# Patient Record
Sex: Female | Born: 1990 | Hispanic: Yes | Marital: Single | Smoking: Never smoker
Health system: Southern US, Community
[De-identification: ages and names within clinical notes are randomized; demographics above are authoritative.]

## PROBLEM LIST (undated history)

## (undated) DIAGNOSIS — Z8489 Family history of other specified conditions: Secondary | ICD-10-CM

## (undated) DIAGNOSIS — Z789 Other specified health status: Secondary | ICD-10-CM

## (undated) HISTORY — PX: ORIF ANKLE FRACTURE: SUR919

## (undated) HISTORY — PX: NO PAST SURGERIES: SHX2092

---

## 2017-06-18 ENCOUNTER — Other Ambulatory Visit: Payer: Self-pay | Admitting: Obstetrics & Gynecology

## 2017-06-18 DIAGNOSIS — Z363 Encounter for antenatal screening for malformations: Secondary | ICD-10-CM

## 2017-06-19 ENCOUNTER — Ambulatory Visit (INDEPENDENT_AMBULATORY_CARE_PROVIDER_SITE_OTHER): Payer: Self-pay

## 2017-06-19 DIAGNOSIS — Z363 Encounter for antenatal screening for malformations: Secondary | ICD-10-CM

## 2017-06-19 NOTE — Progress Notes (Signed)
US 24+1 wks,cephalic,anterior pl gr 0,normal ovaries bilat,svp of fluid 5.9 cm,fhr 144 bpm,EFW 832 g,cx 3.3 cm,anatomy complete,no obvious abnormalities

## 2017-06-26 ENCOUNTER — Encounter: Payer: Self-pay | Admitting: *Deleted

## 2017-06-28 ENCOUNTER — Encounter: Payer: Self-pay | Admitting: Advanced Practice Midwife

## 2017-06-28 ENCOUNTER — Ambulatory Visit: Payer: Self-pay | Admitting: *Deleted

## 2017-07-03 ENCOUNTER — Ambulatory Visit: Payer: Self-pay | Admitting: *Deleted

## 2017-07-03 ENCOUNTER — Encounter: Payer: Self-pay | Admitting: Advanced Practice Midwife

## 2017-07-11 ENCOUNTER — Encounter: Payer: Self-pay | Admitting: *Deleted

## 2017-07-11 ENCOUNTER — Encounter: Payer: Self-pay | Admitting: Advanced Practice Midwife

## 2017-07-11 ENCOUNTER — Ambulatory Visit: Payer: Self-pay | Admitting: *Deleted

## 2017-12-21 ENCOUNTER — Encounter (HOSPITAL_COMMUNITY): Payer: Self-pay

## 2018-05-04 ENCOUNTER — Emergency Department (HOSPITAL_BASED_OUTPATIENT_CLINIC_OR_DEPARTMENT_OTHER)
Admission: EM | Admit: 2018-05-04 | Discharge: 2018-05-05 | Disposition: A | Discharge status: Home / Self Care | Payer: Self-pay | Attending: Emergency Medicine | Admitting: Emergency Medicine

## 2018-05-04 ENCOUNTER — Emergency Department (HOSPITAL_BASED_OUTPATIENT_CLINIC_OR_DEPARTMENT_OTHER): Payer: Self-pay

## 2018-05-04 ENCOUNTER — Encounter (HOSPITAL_BASED_OUTPATIENT_CLINIC_OR_DEPARTMENT_OTHER): Payer: Self-pay | Admitting: *Deleted

## 2018-05-04 ENCOUNTER — Other Ambulatory Visit: Payer: Self-pay

## 2018-05-04 DIAGNOSIS — S82841A Displaced bimalleolar fracture of right lower leg, initial encounter for closed fracture: Secondary | ICD-10-CM | POA: Insufficient documentation

## 2018-05-04 DIAGNOSIS — Y929 Unspecified place or not applicable: Secondary | ICD-10-CM | POA: Insufficient documentation

## 2018-05-04 DIAGNOSIS — Y999 Unspecified external cause status: Secondary | ICD-10-CM | POA: Insufficient documentation

## 2018-05-04 DIAGNOSIS — X500XXA Overexertion from strenuous movement or load, initial encounter: Secondary | ICD-10-CM | POA: Insufficient documentation

## 2018-05-04 DIAGNOSIS — Y9389 Activity, other specified: Secondary | ICD-10-CM | POA: Insufficient documentation

## 2018-05-04 MED ORDER — OXYCODONE-ACETAMINOPHEN 5-325 MG PO TABS
1.0000 | ORAL_TABLET | Freq: Once | ORAL | Status: AC
  Administered 2018-05-04: 1 via ORAL
  Filled 2018-05-04: qty 1

## 2018-05-04 NOTE — ED Triage Notes (Signed)
Pt twisted right ankle while walking down steps. Spanish interpreter Shari Prows [phone] utilized

## 2018-05-04 NOTE — ED Notes (Signed)
ED Provider at bedside. 

## 2018-05-04 NOTE — ED Provider Notes (Signed)
[facility] EMERGENCY DEPARTMENT Provider Note   CSN: [id number] Arrival date & time: 05/04/18  2227    History   Chief Complaint Chief Complaint  Patient presents with  . Ankle Pain    HPI Katrina West is a 28 y.o. female.     Patient with right ankle pain after missing 1 step and twisting her right ankle around 9 PM.  She reports severe pain and swelling to both sides of her right ankle joint.  Denies hitting head or any other injury.  No neck or back pain.  She did not take any thing for it at home.  There is no numbness or tingling.  There is no chest pain or shortness of breath.  There are no breaks in the skin.  She denies any other medical problems or any other medications.  Translator used  The history is provided by the patient. The history is limited by a language barrier. A language interpreter was used.  Ankle Pain  Associated symptoms: no back pain, no fever and no neck pain     History reviewed. No pertinent past medical history.  There are no active problems to display for this patient.   History reviewed. No pertinent surgical history.   OB History    Gravida  1   Para      Term      Preterm      AB      Living        SAB      TAB      Ectopic      Multiple      Live Births               Home Medications    Prior to Admission medications   Not on File    Family History No family history on file.  Social History Social History   Tobacco Use  . Smoking status: Never Smoker  . Smokeless tobacco: Never Used  Substance Use Topics  . Alcohol use: Never    Frequency: Never  . Drug use: Never     Allergies   Patient has no known allergies.   Review of Systems Review of Systems  Constitutional: Negative for activity change, appetite change and fever.  HENT: Negative for congestion.   Respiratory: Negative for cough, chest tightness and shortness of breath.   Cardiovascular: Negative for chest pain.    Gastrointestinal: Negative for abdominal pain, nausea and vomiting.  Genitourinary: Negative for dysuria and hematuria.  Musculoskeletal: Positive for arthralgias and myalgias. Negative for back pain and neck pain.  Skin: Negative for rash.  Neurological: Negative for dizziness, weakness and headaches.   all other systems are negative except as noted in the HPI and PMH.    Physical Exam Updated Vital Signs BP 126/87 (BP Location: Right Arm)   Pulse 98   Temp 98.1 F (36.7 C) (Oral)   Resp 16   Ht 4' 9.09" (1.45 m)   Wt 59 kg   LMP 04/13/2018 (Approximate)   SpO2 100%   Breastfeeding No   BMI 28.05 kg/m   Physical Exam Vitals signs and nursing note reviewed.  Constitutional:      General: She is not in acute distress.    Appearance: She is well-developed.  HENT:     Head: Normocephalic and atraumatic.     Mouth/Throat:     Pharynx: No oropharyngeal exudate.  Eyes:     Conjunctiva/sclera: Conjunctivae normal.  Pupils: Pupils are equal, round, and reactive to light.  Neck:     Musculoskeletal: Normal range of motion and neck supple.     Comments: No meningismus. Cardiovascular:     Rate and Rhythm: Normal rate and regular rhythm.     Heart sounds: Normal heart sounds. No murmur.  Pulmonary:     Effort: Pulmonary effort is normal. No respiratory distress.     Breath sounds: Normal breath sounds.  Abdominal:     Palpations: Abdomen is soft.     Tenderness: There is no abdominal tenderness. There is no guarding or rebound.  Musculoskeletal: Normal range of motion.        General: No tenderness.     Right lower leg: Edema present.     Comments: There is diffuse swelling to the right ankle with medial and lateral malleoli tenderness.  No obvious deformity.  Intact DP PT pulse.  Patient able to wiggle toes.  No proximal fibular tenderness. No pain at base of fifth metatarsal. No breaks in the skin.  Skin:    General: Skin is warm.     Capillary Refill: Capillary  refill takes less than 2 seconds.  Neurological:     General: No focal deficit present.     Mental Status: She is alert and oriented to person, place, and time. Mental status is at baseline.     Cranial Nerves: No cranial nerve deficit.     Motor: No abnormal muscle tone.     Coordination: Coordination normal.     Comments:  5/5 strength throughout. CN 2-12 intact.Equal grip strength.   Psychiatric:        Behavior: Behavior normal.      ED Treatments / Results  Labs (all labs ordered are listed, but only abnormal results are displayed) Labs Reviewed - No data to display  EKG None  Radiology Dg Tibia/fibula Right  Result Date: 05/05/2018 CLINICAL DATA:  Right ankle fracture today. EXAM: RIGHT TIBIA AND FIBULA - 2 VIEW COMPARISON:  Right ankle radiographs earlier this day. FINDINGS: Bimalleolar fracture better assessed on ankle exam. The proximal tibia and fibula are intact. No proximal fracture. Nutrient channel in the mid fibula. IMPRESSION: Bimalleolar fracture better assessed on ankle exam. Proximal tibia and fibula are intact. Electronically Signed   By: Narda Rutherford M.D.   On: 05/05/2018 00:25   Dg Ankle Complete Right  Result Date: 05/04/2018 CLINICAL DATA:  Pain after trauma EXAM: RIGHT ANKLE - COMPLETE 3+ VIEW COMPARISON:  None. FINDINGS: A fracture seen through the medial malleolus. A fracture seen through the distal fibula. No dislocation. Significant soft tissue swelling, particularly laterally. No other abnormalities. IMPRESSION: Bimalleolar fractures in the ankle with significant soft tissue swelling. Electronically Signed   By: Gerome Sam III M.D   On: 05/04/2018 23:23    Procedures Procedures (including critical care time)  Medications Ordered in ED Medications  oxyCODONE-acetaminophen (PERCOCET/ROXICET) 5-325 MG per tablet 1 tablet (has no administration in time range)     Initial Impression / Assessment and Plan / ED Course  I have reviewed the triage  vital signs and the nursing notes.  Pertinent labs & imaging results that were available during my care of the patient were reviewed by me and considered in my medical decision making (see chart for details).        Mechanical fall with right ankle pain.  No other injury.  Ankle is neurovascularly intact.  There is soft tissue swelling with bimalleolar fracture seen on  x-ray. No significant displacement.  Proximal tibia and fibula in tact.  Patient splinted and provided crutches.  Pain control, nonweightbearing, elevation, ice, followup with orthopedics.  Return precautions discussed including worsening pain, numbness or tingling, temperature or color change of leg or any other concerns.  Final Clinical Impressions(s) / ED Diagnoses   Final diagnoses:  Closed bimalleolar fracture of right ankle, initial encounter    ED Discharge Orders    None       Ceazia Harb, Jeannett Senior, MD 05/05/18 [phone]

## 2018-05-05 MED ORDER — OXYCODONE-ACETAMINOPHEN 5-325 MG PO TABS: 1.0000 | ORAL_TABLET | ORAL | 0 refills | Status: DC | PRN

## 2018-05-05 NOTE — ED Notes (Addendum)
PMS intact before and after. Pt tolerated well. All questions answered via the interpreter.

## 2018-05-05 NOTE — ED Notes (Signed)
Pt was provided with discharge instructions at length  and education during her splint placement by the interpreter services. Pt's questions were answered and voiced understanding. Pt is aware to call ortho on Monday for f/u and also received education regarding her medications. Pt assisted out to the car in a wheelchair. Tolerated well.

## 2018-05-05 NOTE — Discharge Instructions (Signed)
Do not put weight on your right leg.  Keep leg elevated and use ice.  Follow-up with the orthopedic doctor because this may need to have surgical repair.  Return to the ED if you develop worsening pain, numbness, tingling, any other concerns.

## 2018-05-10 ENCOUNTER — Other Ambulatory Visit: Payer: Self-pay

## 2018-05-10 ENCOUNTER — Ambulatory Visit (INDEPENDENT_AMBULATORY_CARE_PROVIDER_SITE_OTHER): Payer: Self-pay | Admitting: Orthopaedic Surgery

## 2018-05-10 ENCOUNTER — Encounter (INDEPENDENT_AMBULATORY_CARE_PROVIDER_SITE_OTHER): Payer: Self-pay | Admitting: Physician Assistant

## 2018-05-10 DIAGNOSIS — W109XXA Fall (on) (from) unspecified stairs and steps, initial encounter: Secondary | ICD-10-CM

## 2018-05-10 DIAGNOSIS — M25571 Pain in right ankle and joints of right foot: Secondary | ICD-10-CM

## 2018-05-10 DIAGNOSIS — S82841A Displaced bimalleolar fracture of right lower leg, initial encounter for closed fracture: Secondary | ICD-10-CM

## 2018-05-10 MED ORDER — HYDROCODONE-ACETAMINOPHEN 5-325 MG PO TABS: 1.0000 | ORAL_TABLET | Freq: Three times a day (TID) | ORAL | 0 refills | Status: DC | PRN

## 2018-05-10 MED ORDER — ONDANSETRON HCL 4 MG PO TABS: 4.0000 mg | ORAL_TABLET | Freq: Three times a day (TID) | ORAL | 0 refills | Status: AC | PRN

## 2018-05-10 NOTE — Progress Notes (Signed)
Office Visit Note   Patient: Katrina West           Date of Birth: 1990-10-21           MRN: [id number] Visit Date: 05/10/2018              Requested by: No referring provider defined for this encounter. PCP: Patient, No Pcp Per   Assessment & Plan: Visit Diagnoses:  1. Closed bimalleolar fracture of right ankle, initial encounter     Plan: Impression is right ankle displaced bimalleolar fracture.  We will place the patient in a new short leg splint today.  She will elevate at all times over the next several days.  She will follow-up with [place] Tuesday when Dr. Deno Etienne is back in the office for surgical consultation which will likely be Wednesday or Thursday.  She has been instructed to go ahead and stop her naproxen and any other anti-inflammatories.  I will call in Norco and Zofran in the meantime.  Follow-Up Instructions: Return in about 4 days (around 05/14/2018) for sx consultation with xu.   Orders:  No orders of the defined types were placed in this encounter.  Meds ordered this encounter  Medications  . HYDROcodone-acetaminophen (NORCO) 5-325 MG tablet    Sig: Take 1 tablet by mouth 3 (three) times daily as needed for moderate pain.    Dispense:  20 tablet    Refill:  0  . ondansetron (ZOFRAN) 4 MG tablet    Sig: Take 1 tablet (4 mg total) by mouth 3 (three) times daily as needed for nausea or vomiting.    Dispense:  20 tablet    Refill:  0      Procedures: No procedures performed   Clinical Data: No additional findings.   Subjective: Chief Complaint  Patient presents with  . Right Ankle - Pain    HPI patient is a pleasant 28 year old Spanish-speaking girl who comes in today for right ankle injury.  On 05/04/2018, she fell going down a set of stairs.  She was seen in the ED where x-rays were obtained.  These showed a displaced bimalleolar fracture.  She was placed in a short leg splint nonweightbearing.  She comes in today for further evaluation treatment  recommendation.  She does note she has been taking oxycodone for pain but this is been causing moderate amount of nausea.  No fevers or chills.  Review of Systems as detailed in HPI.  All others reviewed and are negative.   Objective: Vital Signs: LMP 04/13/2018 (Approximate)   Physical Exam well-developed well-nourished female in no acute distress.  Alert and oriented x3.  Ortho Exam examination of her right ankle reveals marked swelling.  No evidence of fracture blisters.  Calf is soft and nontender.  She is neurovascularly intact distally.  Specialty Comments:  No specialty comments available.  Imaging: X-rays reviewed by me in canopy show a bimalleolar ankle fracture on the right   PMFS History: Patient Active Problem List   Diagnosis Date Noted  . Closed bimalleolar fracture of right ankle 05/10/2018   History reviewed. No pertinent past medical history.  History reviewed. No pertinent family history.  History reviewed. No pertinent surgical history. Social History   Occupational History  . Not on file  Tobacco Use  . Smoking status: Never Smoker  . Smokeless tobacco: Never Used  Substance and Sexual Activity  . Alcohol use: Never    Frequency: Never  . Drug use: Never  . Sexual  activity: Not on file

## 2018-05-14 ENCOUNTER — Encounter (HOSPITAL_BASED_OUTPATIENT_CLINIC_OR_DEPARTMENT_OTHER): Payer: Self-pay | Admitting: *Deleted

## 2018-05-14 ENCOUNTER — Other Ambulatory Visit: Payer: Self-pay

## 2018-05-14 ENCOUNTER — Ambulatory Visit (INDEPENDENT_AMBULATORY_CARE_PROVIDER_SITE_OTHER): Payer: Self-pay | Admitting: Orthopaedic Surgery

## 2018-05-14 ENCOUNTER — Encounter (INDEPENDENT_AMBULATORY_CARE_PROVIDER_SITE_OTHER): Payer: Self-pay | Admitting: Orthopaedic Surgery

## 2018-05-14 VITALS — Ht <= 58 in | Wt 130.0 lb

## 2018-05-14 DIAGNOSIS — S82841A Displaced bimalleolar fracture of right lower leg, initial encounter for closed fracture: Secondary | ICD-10-CM

## 2018-05-14 DIAGNOSIS — X501XXA Overexertion from prolonged static or awkward postures, initial encounter: Secondary | ICD-10-CM

## 2018-05-14 NOTE — Progress Notes (Signed)
   Office Visit Note   Patient: Katrina West           Date of Birth: [birth year]           MRN: [id number] Visit Date: 05/14/2018              Requested by: No referring provider defined for this encounter. PCP: Patient, No Pcp Per   Assessment & Plan: Visit Diagnoses:  1. Closed bimalleolar fracture of right ankle, initial encounter     Plan: We reviewed the x-rays and the treatment plan through the interpreter and discussed the details of the surgery as well as risks and benefits and alternatives.  She understands that close treatment for displaced bimalleolar ankle fracture is not recommended.  We will proceed with operative fixation.  Rehab and recovery were discussed today.  Continue with elevation.  We will plan on surgery later this week.   Today's encounter was more complex due to language barrier.  Follow-Up Instructions: Return for 2 week postop visit.   Orders:  No orders of the defined types were placed in this encounter.  No orders of the defined types were placed in this encounter.     Procedures: No procedures performed   Clinical Data: No additional findings.   Subjective: Chief Complaint  Patient presents with  . Right Ankle - Follow-up    Ms. Pehrson follows up today for her bimalleolar ankle fracture.  She is doing well overall.  Her swelling has improved.   Review of Systems   Objective: Vital Signs: Ht 4' 9.09" (1.45 m)   Wt 130 lb (59 kg)   BMI 28.04 kg/m   Physical Exam  Ortho Exam Right ankle exam shows mild swelling.  The skin does wrinkle to pinching.  No neurovascular compromise. Specialty Comments:  No specialty comments available.  Imaging: No results found.   PMFS History: Patient Active Problem List   Diagnosis Date Noted  . Closed bimalleolar fracture of right ankle 05/10/2018   No past medical history on file.  No family history on file.  No past surgical history on file. Social History   Occupational  History  . Not on file  Tobacco Use  . Smoking status: Never Smoker  . Smokeless tobacco: Never Used  Substance and Sexual Activity  . Alcohol use: Never    Frequency: Never  . Drug use: Never  . Sexual activity: Not on file

## 2018-05-16 ENCOUNTER — Encounter (HOSPITAL_BASED_OUTPATIENT_CLINIC_OR_DEPARTMENT_OTHER)
Admission: RE | Disposition: A | Discharge status: Home / Self Care | Payer: Self-pay | Source: Ambulatory Visit | Attending: Orthopaedic Surgery

## 2018-05-16 ENCOUNTER — Ambulatory Visit (HOSPITAL_BASED_OUTPATIENT_CLINIC_OR_DEPARTMENT_OTHER): Payer: Self-pay | Admitting: Certified Registered"

## 2018-05-16 ENCOUNTER — Encounter (HOSPITAL_BASED_OUTPATIENT_CLINIC_OR_DEPARTMENT_OTHER): Payer: Self-pay | Admitting: *Deleted

## 2018-05-16 ENCOUNTER — Ambulatory Visit (HOSPITAL_COMMUNITY): Payer: Self-pay

## 2018-05-16 ENCOUNTER — Other Ambulatory Visit: Payer: Self-pay

## 2018-05-16 ENCOUNTER — Ambulatory Visit (HOSPITAL_BASED_OUTPATIENT_CLINIC_OR_DEPARTMENT_OTHER)
Admission: RE | Admit: 2018-05-16 | Discharge: 2018-05-16 | Disposition: A | Discharge status: Home / Self Care | Payer: Self-pay | Source: Ambulatory Visit | Attending: Orthopaedic Surgery | Admitting: Orthopaedic Surgery

## 2018-05-16 DIAGNOSIS — S82891A Other fracture of right lower leg, initial encounter for closed fracture: Secondary | ICD-10-CM

## 2018-05-16 DIAGNOSIS — X58XXXA Exposure to other specified factors, initial encounter: Secondary | ICD-10-CM | POA: Insufficient documentation

## 2018-05-16 DIAGNOSIS — Z880 Allergy status to penicillin: Secondary | ICD-10-CM | POA: Insufficient documentation

## 2018-05-16 DIAGNOSIS — S82841A Displaced bimalleolar fracture of right lower leg, initial encounter for closed fracture: Secondary | ICD-10-CM | POA: Insufficient documentation

## 2018-05-16 HISTORY — DX: Family history of other specified conditions: Z84.89

## 2018-05-16 HISTORY — DX: Other specified health status: Z78.9

## 2018-05-16 HISTORY — PX: ORIF ANKLE FRACTURE: SHX5408

## 2018-05-16 LAB — POCT PREGNANCY, URINE: Preg Test, Ur: NEGATIVE

## 2018-05-16 SURGERY — OPEN REDUCTION INTERNAL FIXATION (ORIF) ANKLE FRACTURE
Anesthesia: Regional | Site: Ankle | Laterality: Right

## 2018-05-16 MED ORDER — LACTATED RINGERS IV SOLN
INTRAVENOUS | Status: DC
  Administered 2018-05-16 (×2): via INTRAVENOUS

## 2018-05-16 MED ORDER — CALCIUM CARBONATE-VITAMIN D 500-200 MG-UNIT PO TABS: 1.0000 | ORAL_TABLET | Freq: Three times a day (TID) | ORAL | 6 refills | Status: AC

## 2018-05-16 MED ORDER — CEFAZOLIN SODIUM-DEXTROSE 2-4 GM/100ML-% IV SOLN: 2.0000 g | INTRAVENOUS | Status: DC

## 2018-05-16 MED ORDER — PROPOFOL 10 MG/ML IV BOLUS
INTRAVENOUS | Status: DC | PRN
  Administered 2018-05-16: 200 mg via INTRAVENOUS

## 2018-05-16 MED ORDER — CEFAZOLIN SODIUM-DEXTROSE 2-4 GM/100ML-% IV SOLN
INTRAVENOUS | Status: AC
  Filled 2018-05-16: qty 100

## 2018-05-16 MED ORDER — ZINC SULFATE 220 (50 ZN) MG PO CAPS: 220.0000 mg | ORAL_CAPSULE | Freq: Every day | ORAL | 0 refills | Status: AC

## 2018-05-16 MED ORDER — FENTANYL CITRATE (PF) 100 MCG/2ML IJ SOLN
INTRAMUSCULAR | Status: AC
  Filled 2018-05-16: qty 2

## 2018-05-16 MED ORDER — KETOROLAC TROMETHAMINE 30 MG/ML IJ SOLN: 30.0000 mg | Freq: Once | INTRAMUSCULAR | Status: DC | PRN

## 2018-05-16 MED ORDER — 0.9 % SODIUM CHLORIDE (POUR BTL) OPTIME
TOPICAL | Status: DC | PRN
  Administered 2018-05-16: 4000 mL

## 2018-05-16 MED ORDER — ROPIVACAINE HCL 5 MG/ML IJ SOLN
INTRAMUSCULAR | Status: DC | PRN
  Administered 2018-05-16 (×2): 20 mL via PERINEURAL

## 2018-05-16 MED ORDER — LACTATED RINGERS IV SOLN
INTRAVENOUS | Status: DC
  Administered 2018-05-16: 12:00:00 via INTRAVENOUS

## 2018-05-16 MED ORDER — CHLORHEXIDINE GLUCONATE 4 % EX LIQD: 60.0000 mL | Freq: Once | CUTANEOUS | Status: DC

## 2018-05-16 MED ORDER — PROMETHAZINE HCL 25 MG/ML IJ SOLN: 6.2500 mg | INTRAMUSCULAR | Status: DC | PRN

## 2018-05-16 MED ORDER — ASPIRIN EC 81 MG PO TBEC: 81.0000 mg | DELAYED_RELEASE_TABLET | Freq: Two times a day (BID) | ORAL | 0 refills | Status: AC

## 2018-05-16 MED ORDER — ONDANSETRON HCL 4 MG PO TABS: 4.0000 mg | ORAL_TABLET | Freq: Three times a day (TID) | ORAL | 0 refills | Status: AC | PRN

## 2018-05-16 MED ORDER — HYDROMORPHONE HCL 1 MG/ML IJ SOLN: 0.2500 mg | INTRAMUSCULAR | Status: DC | PRN

## 2018-05-16 MED ORDER — OXYCODONE-ACETAMINOPHEN 5-325 MG PO TABS: 1.0000 | ORAL_TABLET | Freq: Three times a day (TID) | ORAL | 0 refills | Status: AC | PRN

## 2018-05-16 MED ORDER — MIDAZOLAM HCL 2 MG/2ML IJ SOLN
1.0000 mg | INTRAMUSCULAR | Status: DC | PRN
  Administered 2018-05-16: 2 mg via INTRAVENOUS

## 2018-05-16 MED ORDER — LIDOCAINE HCL (CARDIAC) PF 100 MG/5ML IV SOSY
PREFILLED_SYRINGE | INTRAVENOUS | Status: DC | PRN
  Administered 2018-05-16: 40 mg via INTRAVENOUS

## 2018-05-16 MED ORDER — OXYCODONE HCL 5 MG/5ML PO SOLN: 5.0000 mg | Freq: Once | ORAL | Status: DC | PRN

## 2018-05-16 MED ORDER — SCOPOLAMINE 1 MG/3DAYS TD PT72: 1.0000 | MEDICATED_PATCH | Freq: Once | TRANSDERMAL | Status: DC | PRN

## 2018-05-16 MED ORDER — ONDANSETRON HCL 4 MG/2ML IJ SOLN
INTRAMUSCULAR | Status: DC | PRN
  Administered 2018-05-16: 4 mg via INTRAVENOUS

## 2018-05-16 MED ORDER — FENTANYL CITRATE (PF) 100 MCG/2ML IJ SOLN
50.0000 ug | INTRAMUSCULAR | Status: AC | PRN
  Administered 2018-05-16 (×2): 25 ug via INTRAVENOUS
  Administered 2018-05-16 (×2): 50 ug via INTRAVENOUS

## 2018-05-16 MED ORDER — ACETAMINOPHEN 500 MG PO TABS
ORAL_TABLET | ORAL | Status: AC
  Filled 2018-05-16: qty 2

## 2018-05-16 MED ORDER — OXYCODONE HCL 5 MG PO TABS: 5.0000 mg | ORAL_TABLET | Freq: Once | ORAL | Status: DC | PRN

## 2018-05-16 MED ORDER — MIDAZOLAM HCL 2 MG/2ML IJ SOLN
INTRAMUSCULAR | Status: AC
  Filled 2018-05-16: qty 2

## 2018-05-16 MED ORDER — ACETAMINOPHEN 500 MG PO TABS
1000.0000 mg | ORAL_TABLET | Freq: Once | ORAL | Status: AC
  Administered 2018-05-16: 1000 mg via ORAL

## 2018-05-16 SURGICAL SUPPLY — 95 items
BANDAGE ACE 4X5 VEL STRL LF (GAUZE/BANDAGES/DRESSINGS) ×2 IMPLANT
BANDAGE ACE 6X5 VEL STRL LF (GAUZE/BANDAGES/DRESSINGS) ×2 IMPLANT
BANDAGE ESMARK 6X9 LF (GAUZE/BANDAGES/DRESSINGS) ×1 IMPLANT
BIT DRILL 2.7 QC CANN 155 (BIT) ×2 IMPLANT
BIT DRILL OVR 3.5AO QC SHRT SM (DRILL) ×1 IMPLANT
BIT DRILL QC 2.5MM SHRT EVO SM (DRILL) ×1 IMPLANT
BLADE HEX COATED 2.75 (ELECTRODE) ×2 IMPLANT
BLADE SURG 15 STRL LF DISP TIS (BLADE) ×2 IMPLANT
BLADE SURG 15 STRL SS (BLADE) ×2
BNDG COHESIVE 6X5 TAN STRL LF (GAUZE/BANDAGES/DRESSINGS) ×2 IMPLANT
BNDG ESMARK 6X9 LF (GAUZE/BANDAGES/DRESSINGS) ×2
BRUSH SCRUB EZ PLAIN DRY (MISCELLANEOUS) ×2 IMPLANT
CANISTER SUCT 1200ML W/VALVE (MISCELLANEOUS) ×2 IMPLANT
COVER BACK TABLE 60X90IN (DRAPES) ×2 IMPLANT
COVER MAYO STAND STRL (DRAPES) ×2 IMPLANT
COVER WAND RF STERILE (DRAPES) IMPLANT
CUFF TOURN SGL QUICK 24 (TOURNIQUET CUFF) ×1
CUFF TOURN SGL QUICK 34 (TOURNIQUET CUFF)
CUFF TRNQT CYL 24X4X16.5-23 (TOURNIQUET CUFF) ×1 IMPLANT
CUFF TRNQT CYL 34X4.125X (TOURNIQUET CUFF) IMPLANT
DECANTER SPIKE VIAL GLASS SM (MISCELLANEOUS) IMPLANT
DRAPE C-ARM 42X72 X-RAY (DRAPES) ×2 IMPLANT
DRAPE C-ARMOR (DRAPES) ×2 IMPLANT
DRAPE EXTREMITY T 121X128X90 (DISPOSABLE) ×2 IMPLANT
DRAPE IMP U-DRAPE 54X76 (DRAPES) ×2 IMPLANT
DRAPE INCISE IOBAN 66X45 STRL (DRAPES) IMPLANT
DRAPE SURG 17X23 STRL (DRAPES) ×4 IMPLANT
DRILL OVER 3.5 AO QC SHORT SM (DRILL) ×2
DRILL QC 2.5MM SHORT EVOS SM (DRILL) ×2
DRSG PAD ABDOMINAL 8X10 ST (GAUZE/BANDAGES/DRESSINGS) ×4 IMPLANT
DURAPREP 26ML APPLICATOR (WOUND CARE) ×4 IMPLANT
ELECT REM PT RETURN 9FT ADLT (ELECTROSURGICAL) ×2
ELECTRODE REM PT RTRN 9FT ADLT (ELECTROSURGICAL) ×1 IMPLANT
GAUZE SPONGE 4X4 12PLY STRL (GAUZE/BANDAGES/DRESSINGS) ×2 IMPLANT
GAUZE XEROFORM 1X8 LF (GAUZE/BANDAGES/DRESSINGS) ×2 IMPLANT
GLOVE BIO SURGEON STRL SZ 6.5 (GLOVE) ×2 IMPLANT
GLOVE BIOGEL PI IND STRL 7.0 (GLOVE) ×4 IMPLANT
GLOVE BIOGEL PI INDICATOR 7.0 (GLOVE) ×4
GLOVE ECLIPSE 6.5 STRL STRAW (GLOVE) ×4 IMPLANT
GLOVE ECLIPSE 7.0 STRL STRAW (GLOVE) ×2 IMPLANT
GLOVE SKINSENSE NS SZ7.5 (GLOVE) ×1
GLOVE SKINSENSE STRL SZ7.5 (GLOVE) ×1 IMPLANT
GLOVE SURG SYN 7.5  E (GLOVE) ×1
GLOVE SURG SYN 7.5 E (GLOVE) ×1 IMPLANT
GOWN STRL REIN XL XLG (GOWN DISPOSABLE) ×2 IMPLANT
GOWN STRL REUS W/ TWL LRG LVL3 (GOWN DISPOSABLE) ×1 IMPLANT
GOWN STRL REUS W/ TWL XL LVL3 (GOWN DISPOSABLE) ×2 IMPLANT
GOWN STRL REUS W/TWL LRG LVL3 (GOWN DISPOSABLE) ×1
GOWN STRL REUS W/TWL XL LVL3 (GOWN DISPOSABLE) ×2
GUIDE PIN 1.3 (PIN) ×4
MANIFOLD NEPTUNE II (INSTRUMENTS) ×2 IMPLANT
NEEDLE HYPO 22GX1.5 SAFETY (NEEDLE) IMPLANT
NS IRRIG 1000ML POUR BTL (IV SOLUTION) ×6 IMPLANT
PACK BASIN DAY SURGERY FS (CUSTOM PROCEDURE TRAY) ×2 IMPLANT
PAD CAST 3X4 CTTN HI CHSV (CAST SUPPLIES) IMPLANT
PAD CAST 4YDX4 CTTN HI CHSV (CAST SUPPLIES) ×1 IMPLANT
PADDING CAST COTTON 3X4 STRL (CAST SUPPLIES)
PADDING CAST COTTON 4X4 STRL (CAST SUPPLIES) ×1
PADDING CAST COTTON 6X4 STRL (CAST SUPPLIES) IMPLANT
PADDING CAST SYN 6 (CAST SUPPLIES) ×1
PADDING CAST SYNTHETIC 4 (CAST SUPPLIES) ×1
PADDING CAST SYNTHETIC 4X4 STR (CAST SUPPLIES) ×1 IMPLANT
PADDING CAST SYNTHETIC 6X4 NS (CAST SUPPLIES) ×1 IMPLANT
PENCIL BUTTON HOLSTER BLD 10FT (ELECTRODE) ×2 IMPLANT
PIN GUIDE 1.3 (PIN) ×2 IMPLANT
PLATE 3.5 LOCK 7H 1/3 TUBULAR (Plate) ×2 IMPLANT
SCREW CANC 4.7X14MM (Screw) ×2 IMPLANT
SCREW CANC EVOS 4.7X12 FT (Screw) ×2 IMPLANT
SCREW CANNULATED 4.0X35 (Screw) ×2 IMPLANT
SCREW CANNULATED 4.0X36 (Screw) ×2 IMPLANT
SCREW CORT 3.5X10MM ST EVOS (Screw) ×4 IMPLANT
SCREW CORT 3.5X11 ST EVOS (Screw) ×2 IMPLANT
SCREW CORTEX 3.5X18 EVOS (Screw) ×2 IMPLANT
SCREW LOCK 3.5X14MM EVOS (Screw) ×2 IMPLANT
SCREW OST EVOS FT 4.7X16 (Screw) ×2 IMPLANT
SHEET MEDIUM DRAPE 40X70 STRL (DRAPES) ×2 IMPLANT
SLEEVE SCD COMPRESS KNEE MED (MISCELLANEOUS) ×2 IMPLANT
SPLINT FIBERGLASS 4X30 (CAST SUPPLIES) IMPLANT
SPONGE LAP 18X18 RF (DISPOSABLE) ×2 IMPLANT
SUCTION FRAZIER HANDLE 10FR (MISCELLANEOUS) ×1
SUCTION TUBE FRAZIER 10FR DISP (MISCELLANEOUS) ×1 IMPLANT
SUT ETHILON 3 0 PS 1 (SUTURE) ×8 IMPLANT
SUT VIC AB 0 CT1 27 (SUTURE)
SUT VIC AB 0 CT1 27XBRD ANBCTR (SUTURE) IMPLANT
SUT VIC AB 2-0 CT1 27 (SUTURE) ×2
SUT VIC AB 2-0 CT1 TAPERPNT 27 (SUTURE) ×2 IMPLANT
SUT VIC AB 3-0 SH 27 (SUTURE)
SUT VIC AB 3-0 SH 27X BRD (SUTURE) IMPLANT
SYR BULB 3OZ (MISCELLANEOUS) ×2 IMPLANT
SYR CONTROL 10ML LL (SYRINGE) IMPLANT
TOWEL GREEN STERILE FF (TOWEL DISPOSABLE) ×2 IMPLANT
TRAY DSU PREP LF (CUSTOM PROCEDURE TRAY) ×2 IMPLANT
TUBE CONNECTING 20X1/4 (TUBING) ×2 IMPLANT
UNDERPAD 30X30 (UNDERPADS AND DIAPERS) ×2 IMPLANT
YANKAUER SUCT BULB TIP NO VENT (SUCTIONS) ×2 IMPLANT

## 2018-05-16 NOTE — Transfer of Care (Signed)
Immediate Anesthesia Transfer of Care Note  Patient: Katrina West  Procedure(s) Performed: OPEN REDUCTION INTERNAL FIXATION (ORIF) RIGHT BIMALLEOLAR ANKLE FRACTURE (Right Ankle)  Patient Location: PACU  Anesthesia Type:General  Level of Consciousness: awake, alert  and oriented  Airway & Oxygen Therapy: Patient Spontanous Breathing and Patient connected to face mask oxygen  Post-op Assessment: Report given to RN and Post -op Vital signs reviewed and stable  Post vital signs: Reviewed and stable  Last Vitals:  Vitals Value Taken Time  BP 98/64 05/16/2018  2:30 PM  Temp    Pulse 87 05/16/2018  2:31 PM  Resp 19 05/16/2018  2:31 PM  SpO2 100 % 05/16/2018  2:31 PM  Vitals shown include unvalidated device data.  Last Pain:  Vitals:   05/16/18 1130  TempSrc: Oral  PainSc: 0-No pain         Complications: No apparent anesthesia complications

## 2018-05-16 NOTE — Anesthesia Procedure Notes (Signed)
Procedure Name: LMA Insertion Date/Time: 05/16/2018 1:07 PM Performed by: Burna Cash, CRNA Pre-anesthesia Checklist: Patient identified, Emergency Drugs available, Suction available and Patient being monitored Patient Re-evaluated:Patient Re-evaluated prior to induction Oxygen Delivery Method: Circle system utilized Preoxygenation: Pre-oxygenation with 100% oxygen Induction Type: IV induction Ventilation: Mask ventilation without difficulty LMA: LMA inserted LMA Size: 4.0 Number of attempts: 1 Airway Equipment and Method: Bite block Placement Confirmation: positive ETCO2 Tube secured with: Tape Dental Injury: Teeth and Oropharynx as per pre-operative assessment

## 2018-05-16 NOTE — Anesthesia Postprocedure Evaluation (Signed)
Anesthesia Post Note  Patient: Gazella Sterman  Procedure(s) Performed: OPEN REDUCTION INTERNAL FIXATION (ORIF) RIGHT BIMALLEOLAR ANKLE FRACTURE (Right Ankle)     Patient location during evaluation: PACU Anesthesia Type: Regional and General Level of consciousness: awake and alert Pain management: pain level controlled Vital Signs Assessment: post-procedure vital signs reviewed and stable Respiratory status: spontaneous breathing, nonlabored ventilation, respiratory function stable and patient connected to nasal cannula oxygen Cardiovascular status: blood pressure returned to baseline and stable Postop Assessment: no apparent nausea or vomiting Anesthetic complications: no    Last Vitals:  Vitals:   05/16/18 1515 05/16/18 1537  BP:  120/88  Pulse:  82  Resp: 16 18  Temp:  36.6 C  SpO2: 100% 100%    Last Pain:  Vitals:   05/16/18 1537  TempSrc:   PainSc: 2         RLE Motor Response: Purposeful movement (05/16/18 1537) RLE Sensation: Decreased (05/16/18 1537)      Afton Lavalle P Dmitri Pettigrew

## 2018-05-16 NOTE — Op Note (Signed)
Date of Surgery: 05/16/2018  INDICATIONS: Katrina West is a 28 y.o.-year-old female who sustained a right ankle fracture; she was indicated for open reduction and internal fixation due to the displaced nature of the articular fracture and came to the operating room today for this procedure. The patient did consent to the procedure after discussion of the risks and benefits.  PREOPERATIVE DIAGNOSIS: right bimalleolar ankle fracture  POSTOPERATIVE DIAGNOSIS: Same.  PROCEDURE: Open treatment of right ankle fracture with internal fixation. Bimalleolar CPT [phone] SURGEON: N. Glee Arvin, M.D.  ASSIST: Starlyn Skeans [place], [place]; necessary for the timely completion of procedure and due to complexity of procedure.  ANESTHESIA:  general, popliteal block  TOURNIQUET TIME: less than 60 mins  IV FLUIDS AND URINE: See anesthesia.  ESTIMATED BLOOD LOSS: minimal mL.  IMPLANTS: [employer]  COMPLICATIONS: None.  DESCRIPTION OF PROCEDURE: The patient was brought to the operating room and placed supine on the operating table.  The patient had been signed prior to the procedure and this was documented. The patient had the anesthesia placed by the anesthesiologist.  A nonsterile tourniquet was placed on the upper thigh.  The prep verification and incision time-outs were performed to confirm that this was the correct patient, site, side and location. The patient had an SCD on the opposite lower extremity. The patient did receive antibiotics prior to the incision and was re-dosed during the procedure as needed at indicated intervals.  The patient had the lower extremity prepped and draped in the standard surgical fashion.  The extremity was exsanguinated using an esmarch bandage and the tourniquet was inflated to 300 mm Hg.  An incision was made over the lateral aspect of the distal fibula.  Dissection was carried through the subcutaneous tissue and full-thickness flaps were elevated off of the fibula.   The fracture line was visualized and organized hematoma and entrapped periosteum were removed from the fracture site.  The fracture was then reduced using traction and rotation and clamped with a tenaculum.  I then placed a single anterior to posterior lag screw using standard AO technique which gave excellent purchase and compression across the fracture.  The clamp was then removed and I placed a semitubular locking plate on the lateral aspect of the fibula at the appropriate position using fluoroscopic guidance.  Nonlocking and locking screws were then placed through the plate bicortically using standard AO technique.  I then made a separate incision over the medial malleolus and dissection was carried through the subcutaneous tissue and full-thickness flaps were elevated.  The fracture was booked open and the entrapped periosteum was removed.  The fracture was then reduced with a tenaculum and 2 parallel K wires were advanced across the fracture using fluoroscopic guidance.  I then placed 2 cannulated screws over the K wires with one being partially-threaded to compress the fracture and the second 1 being fully threaded to hold the fracture.  The K wires were then removed.  Stress exam of the ankle showed no widening medial clear space.  The wounds were then thoroughly irrigated and closed in layered fashion using 0 Vicryl, 2-0 Vicryl, 3-0 nylon.  Sterile dressings were applied.  Short leg splint was placed.  Patient tolerated procedure well had no immediate complications.  POSTOPERATIVE PLAN: Ms. Krotzer will remain nonweightbearing on this leg for approximately 6 weeks; Ms. Ruan will return for suture removal in 2 weeks.  He will be immobilized in a short leg splint and then transitioned to a CAM walker  at his first follow up appointment.  Ms. Aston will receive DVT prophylaxis based on other medications, activity level, and risk ratio of bleeding to thrombosis.  Mayra Reel, MD [facility] [phone] 2:07 PM

## 2018-05-16 NOTE — Anesthesia Procedure Notes (Signed)
Anesthesia Regional Block: Popliteal block   Pre-Anesthetic Checklist: ,, timeout performed, Correct Patient, Correct Site, Correct Laterality, Correct Procedure, Correct Position, site marked, Risks and benefits discussed,  Surgical consent,  Pre-op evaluation,  At surgeon's request and post-op pain management  Laterality: Right  Prep: chloraprep       Needles:  Injection technique: Single-shot  Needle Type: Echogenic Stimulator Needle     Needle Length: 9cm  Needle Gauge: 21     Additional Needles:   Procedures:,,,, ultrasound used (permanent image in chart),,,,  Narrative:  Start time: 05/16/2018 12:25 PM End time: 05/16/2018 12:35 PM Injection made incrementally with aspirations every 5 mL.  Performed by: Personally  Anesthesiologist: Leonides Grills, MD  Additional Notes: Functioning IV was confirmed and monitors were applied.  A timeout was performed. Sterile prep, hand hygiene and sterile gloves were used. A 36mm 21ga Arrow echogenic stimulator needle was used. Negative aspiration and negative test dose prior to incremental administration of local anesthetic. The patient tolerated the procedure well.  Ultrasound guidance: relevent anatomy identified, needle position confirmed, local anesthetic spread visualized around nerve(s), vascular puncture avoided.  Image printed for medical record.

## 2018-05-16 NOTE — H&P (Signed)
PREOPERATIVE H&P  Chief Complaint: right bimalleolar ankle fracture  HPI: Katrina West is a 28 y.o. female who presents for surgical treatment of right bimalleolar ankle fracture.  She denies any changes in medical history.  Past Medical History:  Diagnosis Date  . Family history of adverse reaction to anesthesia    uncle "hard time breathing with anesthesia"  . Medical history non-contributory    Past Surgical History:  Procedure Laterality Date  . NO PAST SURGERIES     Social History   Socioeconomic History  . Marital status: Single    Spouse name: Not on file  . Number of children: Not on file  . Years of education: Not on file  . Highest education level: Not on file  Occupational History  . Not on file  Social Needs  . Financial resource strain: Not on file  . Food insecurity:    Worry: Not on file    Inability: Not on file  . Transportation needs:    Medical: Not on file    Non-medical: Not on file  Tobacco Use  . Smoking status: Never Smoker  . Smokeless tobacco: Never Used  Substance and Sexual Activity  . Alcohol use: Never    Frequency: Never  . Drug use: Never  . Sexual activity: Not on file  Lifestyle  . Physical activity:    Days per week: Not on file    Minutes per session: Not on file  . Stress: Not on file  Relationships  . Social connections:    Talks on phone: Not on file    Gets together: Not on file    Attends religious service: Not on file    Active member of club or organization: Not on file    Attends meetings of clubs or organizations: Not on file    Relationship status: Not on file  Other Topics Concern  . Not on file  Social History Narrative  . Not on file   History reviewed. No pertinent family history. Allergies  Allergen Reactions  . Penicillins     Rash    Prior to Admission medications   Medication Sig Start Date End Date Taking? Authorizing Provider  HYDROcodone-acetaminophen (NORCO) 5-325 MG tablet  Take 1 tablet by mouth 3 (three) times daily as needed for moderate pain. 05/10/18  Yes Cristie Hem, PA-C  oxyCODONE-acetaminophen (PERCOCET/ROXICET) 5-325 MG tablet Take 1 tablet by mouth every 4 (four) hours as needed for severe pain. 05/05/18  Yes Rancour, Jeannett Senior, MD  ondansetron (ZOFRAN) 4 MG tablet Take 1 tablet (4 mg total) by mouth 3 (three) times daily as needed for nausea or vomiting. 05/10/18   Cristie Hem, PA-C     Positive ROS: All other systems have been reviewed and were otherwise negative with the exception of those mentioned in the HPI and as above.  Physical Exam: General: Alert, no acute distress Cardiovascular: No pedal edema Respiratory: No cyanosis, no use of accessory musculature GI: abdomen soft Skin: No lesions in the area of chief complaint Neurologic: Sensation intact distally Psychiatric: Patient is competent for consent with normal mood and affect Lymphatic: no lymphedema  MUSCULOSKELETAL: exam stable  Assessment: right bimalleolar ankle fracture  Plan: Plan for Procedure(s): OPEN REDUCTION INTERNAL FIXATION (ORIF) RIGHT BIMALLEOLAR ANKLE FRACTURE  The risks benefits and alternatives were discussed with the patient including but not limited to the risks of nonoperative treatment, versus surgical intervention including infection, bleeding, nerve injury,  blood clots, cardiopulmonary complications, morbidity,  mortality, among others, and they were willing to proceed.   Glee Arvin, MD   05/16/2018 11:50 AM

## 2018-05-16 NOTE — Discharge Instructions (Signed)
1. Keep splint clean and dry 2. Elevate foot above level of the heart 3. Take aspirin to prevent blood clots 4. Take pain meds as needed 5. Strict non weight bearing to operative extremity     Post Anesthesia Home Care Instructions  Activity: Get plenty of rest for the remainder of the day. A responsible individual must stay with you for 24 hours following the procedure.  For the next 24 hours, DO NOT: -Drive a car -Advertising copywriter -Drink alcoholic beverages -Take any medication unless instructed by your physician -Make any legal decisions or sign important papers.  Meals: Start with liquid foods such as gelatin or soup. Progress to regular foods as tolerated. Avoid greasy, spicy, heavy foods. If nausea and/or vomiting occur, drink only clear liquids until the nausea and/or vomiting subsides. Call your physician if vomiting continues.  Special Instructions/Symptoms: Your throat may feel dry or sore from the anesthesia or the breathing tube placed in your throat during surgery. If this causes discomfort, gargle with warm salt water. The discomfort should disappear within 24 hours.  If you had a scopolamine patch placed behind your ear for the management of post- operative nausea and/or vomiting:  1. The medication in the patch is effective for 72 hours, after which it should be removed.  Wrap patch in a tissue and discard in the trash. Wash hands thoroughly with soap and water. 2. You may remove the patch earlier than 72 hours if you experience unpleasant side effects which may include dry mouth, dizziness or visual disturbances. 3. Avoid touching the patch. Wash your hands with soap and water after contact with the patch.     Post Anesthesia Home Care Instructions  Activity: Get plenty of rest for the remainder of the day. A responsible individual must stay with you for 24 hours following the procedure.  For the next 24 hours, DO NOT: -Drive a car -Social worker -Drink alcoholic beverages -Take any medication unless instructed by your physician -Make any legal decisions or sign important papers.  Meals: Start with liquid foods such as gelatin or soup. Progress to regular foods as tolerated. Avoid greasy, spicy, heavy foods. If nausea and/or vomiting occur, drink only clear liquids until the nausea and/or vomiting subsides. Call your physician if vomiting continues.  Special Instructions/Symptoms: Your throat may feel dry or sore from the anesthesia or the breathing tube placed in your throat during surgery. If this causes discomfort, gargle with warm salt water. The discomfort should disappear within 24 hours.  If you had a scopolamine patch placed behind your ear for the management of post- operative nausea and/or vomiting:  1. The medication in the patch is effective for 72 hours, after which it should be removed.  Wrap patch in a tissue and discard in the trash. Wash hands thoroughly with soap and water. 2. You may remove the patch earlier than 72 hours if you experience unpleasant side effects which may include dry mouth, dizziness or visual disturbances. 3. Avoid touching the patch. Wash your hands with soap and water after contact with the patch.       Regional Anesthesia Blocks  1. Numbness or the inability to move the "blocked" extremity may last from 3-48 hours after placement. The length of time depends on the medication injected and your individual response to the medication. If the numbness is not going away after 48 hours, call your surgeon.  2. The extremity that is blocked will need to be protected until the  numbness is gone and the  Strength has returned. Because you cannot feel it, you will need to take extra care to avoid injury. Because it may be weak, you may have difficulty moving it or using it. You may not know what position it is in without looking at it while the block is in effect.  3. For blocks in the legs and  feet, returning to weight bearing and walking needs to be done carefully. You will need to wait until the numbness is entirely gone and the strength has returned. You should be able to move your leg and foot normally before you try and bear weight or walk. You will need someone to be with you when you first try to ensure you do not fall and possibly risk injury.  4. Bruising and tenderness at the needle site are common side effects and will resolve in a few days.  5. Persistent numbness or new problems with movement should be communicated to the surgeon or the [facility] [phone] [facility] [phone]).

## 2018-05-16 NOTE — Progress Notes (Signed)
AssistedDr. Ellender with right, ultrasound guided, popliteal/saphenous block. Side rails up, monitors on throughout procedure. See vital signs in flow sheet. Tolerated Procedure well.  

## 2018-05-16 NOTE — Anesthesia Procedure Notes (Signed)
Anesthesia Regional Block: Adductor canal block   Pre-Anesthetic Checklist: ,, timeout performed, Correct Patient, Correct Site, Correct Laterality, Correct Procedure,, site marked, risks and benefits discussed, Surgical consent,  Pre-op evaluation,  At surgeon's request and post-op pain management  Laterality: Right  Prep: chloraprep       Needles:  Injection technique: Single-shot  Needle Type: Echogenic Stimulator Needle     Needle Length: 9cm  Needle Gauge: 21     Additional Needles:   Procedures:,,,, ultrasound used (permanent image in chart),,,,  Narrative:  Start time: 05/16/2018 12:30 PM End time: 05/16/2018 12:40 PM Injection made incrementally with aspirations every 5 mL.  Performed by: Personally  Anesthesiologist: Leonides Grills, MD  Additional Notes: Functioning IV was confirmed and monitors were applied. A time-out was performed. Hand hygiene and sterile gloves were used. The thigh was placed in a frog-leg position and prepped in a sterile fashion. A 58mm 21ga Arrow echogenic stimulator needle was placed using ultrasound guidance.  Negative aspiration and negative test dose prior to incremental administration of local anesthetic. The patient tolerated the procedure well.

## 2018-05-16 NOTE — Anesthesia Preprocedure Evaluation (Addendum)
Anesthesia Evaluation  Patient identified by MRN, date of birth, ID band Patient awake    Reviewed: Allergy & Precautions, NPO status , Patient's Chart, lab work & pertinent test results  Airway Mallampati: II  TM Distance: >3 FB Neck ROM: Full    Dental no notable dental hx.    Pulmonary neg pulmonary ROS,    Pulmonary exam normal breath sounds clear to auscultation       Cardiovascular negative cardio ROS Normal cardiovascular exam Rhythm:Regular Rate:Normal     Neuro/Psych negative neurological ROS  negative psych ROS   GI/Hepatic negative GI ROS, Neg liver ROS,   Endo/Other  negative endocrine ROS  Renal/GU negative Renal ROS     Musculoskeletal negative musculoskeletal ROS (+)   Abdominal   Peds  Hematology negative hematology ROS (+)   Anesthesia Other Findings Right bimalleolar ankle fracture  Reproductive/Obstetrics hcg negative                            Anesthesia Physical Anesthesia Plan  ASA: I  Anesthesia Plan: General and Regional   Post-op Pain Management: GA combined w/ Regional for post-op pain   Induction: Intravenous  PONV Risk Score and Plan: 3 and Ondansetron, Dexamethasone, Midazolam and Treatment may vary due to age or medical condition  Airway Management Planned: LMA  Additional Equipment:   Intra-op Plan:   Post-operative Plan: Extubation in OR  Informed Consent: I have reviewed the patients History and Physical, chart, labs and discussed the procedure including the risks, benefits and alternatives for the proposed anesthesia with the patient or authorized representative who has indicated his/her understanding and acceptance.     Dental advisory given  Plan Discussed with: CRNA  Anesthesia Plan Comments:         Anesthesia Quick Evaluation

## 2018-05-18 ENCOUNTER — Encounter (HOSPITAL_COMMUNITY): Payer: Self-pay | Admitting: Emergency Medicine

## 2018-05-18 ENCOUNTER — Emergency Department (HOSPITAL_COMMUNITY): Payer: Self-pay

## 2018-05-18 ENCOUNTER — Emergency Department (HOSPITAL_COMMUNITY)
Admission: EM | Admit: 2018-05-18 | Discharge: 2018-05-18 | Disposition: A | Discharge status: Home / Self Care | Payer: Self-pay | Attending: Emergency Medicine | Admitting: Emergency Medicine

## 2018-05-18 ENCOUNTER — Other Ambulatory Visit: Payer: Self-pay

## 2018-05-18 DIAGNOSIS — G8918 Other acute postprocedural pain: Secondary | ICD-10-CM | POA: Insufficient documentation

## 2018-05-18 DIAGNOSIS — Z7982 Long term (current) use of aspirin: Secondary | ICD-10-CM | POA: Insufficient documentation

## 2018-05-18 DIAGNOSIS — Z79899 Other long term (current) drug therapy: Secondary | ICD-10-CM | POA: Insufficient documentation

## 2018-05-18 MED ORDER — OXYCODONE-ACETAMINOPHEN 5-325 MG PO TABS
2.0000 | ORAL_TABLET | Freq: Once | ORAL | Status: AC
  Administered 2018-05-18: 2 via ORAL
  Filled 2018-05-18: qty 2

## 2018-05-18 NOTE — ED Notes (Signed)
Discharge instructions, pain management, and follow up appt discussed with pt. Pt. Has no questions at this time.

## 2018-05-18 NOTE — ED Triage Notes (Signed)
Pt had ORIF R ankle on 3/19 and reports uncontrolled pain at home.  States she still has pain medication.

## 2018-05-18 NOTE — Progress Notes (Signed)
Orthopedic Tech Progress Note Patient Details:  Katrina West 30-Jun-1990 [id number]  Ortho Devices Type of Ortho Device: Short leg splint Ortho Device/Splint Interventions: Adjustment, Application, Ordered   Post Interventions Patient Tolerated: Well Instructions Provided: Adjustment of device, Care of device   Norva Karvonen T 05/18/2018, 4:01 AM

## 2018-05-18 NOTE — ED Provider Notes (Addendum)
[facility] EMERGENCY DEPARTMENT Provider Note   CSN: [id number] Arrival date & time: 05/18/18  0116    History   Chief Complaint Chief Complaint  Patient presents with  . post-op pain    HPI Katrina West is a 28 y.o. female.     ORIF for ankle fracture 3/19, here with lateral pain to right foot worse when she puts pressure on it. Just since splint placed. No other symptoms.      Past Medical History:  Diagnosis Date  . Family history of adverse reaction to anesthesia    uncle "hard time breathing with anesthesia"  . Medical history non-contributory     Patient Active Problem List   Diagnosis Date Noted  . Closed bimalleolar fracture of right ankle 05/10/2018    Past Surgical History:  Procedure Laterality Date  . NO PAST SURGERIES    . ORIF ANKLE FRACTURE       OB History    Gravida  1   Para      Term      Preterm      AB      Living        SAB      TAB      Ectopic      Multiple      Live Births               Home Medications    Prior to Admission medications   Medication Sig Start Date End Date Taking? Authorizing Provider  aspirin EC 81 MG tablet Take 1 tablet (81 mg total) by mouth 2 (two) times daily. 05/16/18   Tarry Kos, MD  calcium-vitamin D (OSCAL WITH D) 500-200 MG-UNIT tablet Take 1 tablet by mouth 3 (three) times daily. 05/16/18   Tarry Kos, MD  ondansetron (ZOFRAN) 4 MG tablet Take 1 tablet (4 mg total) by mouth 3 (three) times daily as needed for nausea or vomiting. 05/10/18   Cristie Hem, PA-C  ondansetron (ZOFRAN) 4 MG tablet Take 1-2 tablets (4-8 mg total) by mouth every 8 (eight) hours as needed for nausea or vomiting. 05/16/18   Tarry Kos, MD  oxyCODONE-acetaminophen (PERCOCET) 5-325 MG tablet Take 1-2 tablets by mouth every 8 (eight) hours as needed for severe pain. 05/16/18   Tarry Kos, MD  zinc sulfate 220 (50 Zn) MG capsule Take 1 capsule (220 mg total) by mouth daily.  05/16/18   Tarry Kos, MD    Family History No family history on file.  Social History Social History   Tobacco Use  . Smoking status: Never Smoker  . Smokeless tobacco: Never Used  Substance Use Topics  . Alcohol use: Never    Frequency: Never  . Drug use: Never     Allergies   Penicillins   Review of Systems Review of Systems  All other systems reviewed and are negative.    Physical Exam Updated Vital Signs BP 110/75   Pulse 81   Temp (!) 97.3 F (36.3 C) (Oral)   Resp 17   LMP 05/05/2018 (Exact Date) Comment: pt shielded  SpO2 98%   Physical Exam Vitals signs and nursing note reviewed.  Constitutional:      Appearance: She is well-developed.  HENT:     Head: Normocephalic and atraumatic.  Eyes:     Extraocular Movements: Extraocular movements intact.     Conjunctiva/sclera: Conjunctivae normal.     Pupils: Pupils are equal, round,  and reactive to light.  Neck:     Musculoskeletal: Normal range of motion.  Cardiovascular:     Rate and Rhythm: Normal rate and regular rhythm.  Pulmonary:     Effort: Pulmonary effort is normal. No respiratory distress.     Breath sounds: Normal breath sounds. No stridor.  Abdominal:     General: Abdomen is flat. There is no distension.  Musculoskeletal:        General: Swelling (right foot with ecchymosis) present. No tenderness.  Skin:    General: Skin is warm and dry.     Comments: Wound c/d/i  Neurological:     General: No focal deficit present.     Mental Status: She is alert.      ED Treatments / Results  Labs (all labs ordered are listed, but only abnormal results are displayed) Labs Reviewed - No data to display  EKG None  Radiology Dg Ankle Complete Right  Result Date: 05/18/2018 CLINICAL DATA:  Initial evaluation status post ORIF for right ankle fracture. Uncontrolled pain. EXAM: RIGHT ANKLE - COMPLETE 3+ VIEW COMPARISON:  Prior radiographs from 05/04/2018 and 05/16/2018. FINDINGS: Splinting  material overlies the right ankle. Sequelae of recent ORIF for previously identified by malleolar fractures. Fixation hardware in stable position and alignment without hardware complication. Subacute fractures in stable alignment with evidence for some interval healing. No new osseous abnormality. Mild persistent soft tissue swelling about the ankle. IMPRESSION: 1. Sequelae of recent ORIF for subacute by malleolar fractures. No hardware complication or interval malalignment. 2. No other new osseous abnormality about the ankle. Electronically Signed   By: Rise Mu M.D.   On: 05/18/2018 02:34   Dg Ankle Complete Right  Result Date: 05/16/2018 CLINICAL DATA:  The patient suffered right ankle fractures due to a fall down stairs 05/04/2018. Initial encounter. EXAM: RIGHT ANKLE - COMPLETE 3+ VIEW; DG C-ARM 61-120 MIN COMPARISON:  Plain films right ankle 05/04/2018. FINDINGS: Four fluoroscopic intraoperative spot views of the right ankle demonstrate lateral plate and screws and interfragmentary screw and 2 screws in the medial malleolus for fracture fixation. Position and alignment are anatomic. No acute abnormality. IMPRESSION: Intraoperative imaging demonstrating ORIF right ankle fractures. Electronically Signed   By: Drusilla Kanner M.D.   On: 05/16/2018 14:47   Dg C-arm 1-60 Min  Result Date: 05/16/2018 CLINICAL DATA:  The patient suffered right ankle fractures due to a fall down stairs 05/04/2018. Initial encounter. EXAM: RIGHT ANKLE - COMPLETE 3+ VIEW; DG C-ARM 61-120 MIN COMPARISON:  Plain films right ankle 05/04/2018. FINDINGS: Four fluoroscopic intraoperative spot views of the right ankle demonstrate lateral plate and screws and interfragmentary screw and 2 screws in the medial malleolus for fracture fixation. Position and alignment are anatomic. No acute abnormality. IMPRESSION: Intraoperative imaging demonstrating ORIF right ankle fractures. Electronically Signed   By: Drusilla Kanner M.D.    On: 05/16/2018 14:47    Procedures Procedures (including critical care time)  Medications Ordered in ED Medications  oxyCODONE-acetaminophen (PERCOCET/ROXICET) 5-325 MG per tablet 2 tablet (2 tablets Oral Given 05/18/18 0203)     Initial Impression / Assessment and Plan / ED Course  I have reviewed the triage vital signs and the nursing notes.  Pertinent labs & imaging results that were available during my care of the patient were reviewed by me and considered in my medical decision making (see chart for details).   After splint removed, pain gone and seemed to be associated with sharp edge on ortho glass splint, no  e/o dvt/infection or other causes. xr ok. New splint placed. Follow up as previous scheduled.   Final Clinical Impressions(s) / ED Diagnoses   Final diagnoses:  Post-operative pain    ED Discharge Orders    None       Anthany Thornhill, Barbara Cower, MD 05/18/18 [phone]

## 2018-05-20 ENCOUNTER — Encounter (HOSPITAL_BASED_OUTPATIENT_CLINIC_OR_DEPARTMENT_OTHER): Payer: Self-pay | Admitting: Orthopaedic Surgery

## 2018-05-29 ENCOUNTER — Telehealth (INDEPENDENT_AMBULATORY_CARE_PROVIDER_SITE_OTHER): Payer: Self-pay

## 2018-05-29 NOTE — Telephone Encounter (Signed)
Called patient. No answer LMOM to return our call. If they return call, please ask them screening questions below. Thank you.   Do you have now or have you had in the past 7 days a fever and/or chills?   Do you have now or have you had in the past 7 days a cough?   Do you have now or have you had in the last 7 days nausea, vomiting or abdominal pain?   Have you been exposed to anyone who has tested positive for COVID-19?   Have you or anyone who lives with you traveled within the last month? 

## 2018-05-30 ENCOUNTER — Ambulatory Visit (INDEPENDENT_AMBULATORY_CARE_PROVIDER_SITE_OTHER): Payer: Self-pay | Admitting: Physician Assistant

## 2018-05-30 ENCOUNTER — Encounter (INDEPENDENT_AMBULATORY_CARE_PROVIDER_SITE_OTHER): Payer: Self-pay | Admitting: Orthopaedic Surgery

## 2018-05-30 ENCOUNTER — Ambulatory Visit (INDEPENDENT_AMBULATORY_CARE_PROVIDER_SITE_OTHER): Payer: Self-pay

## 2018-05-30 ENCOUNTER — Other Ambulatory Visit: Payer: Self-pay

## 2018-05-30 DIAGNOSIS — S82841D Displaced bimalleolar fracture of right lower leg, subsequent encounter for closed fracture with routine healing: Secondary | ICD-10-CM

## 2018-05-30 NOTE — Progress Notes (Signed)
   Post-Op Visit Note   Patient: Katrina West           Date of Birth: 1990/03/10           MRN: [id number] Visit Date: 05/30/2018 PCP: Patient, No Pcp Per   Assessment & Plan:  Chief Complaint:  Chief Complaint  Patient presents with  . Right Ankle - Pain   Visit Diagnoses:  1. Closed bimalleolar fracture of right ankle with routine healing, subsequent encounter     Plan: Patient is a pleasant 28 year old female who presents our clinic today 14 days status post ORIF right bimalleolar ankle fracture, date of surgery 05/16/2018.  She has been doing fairly well.  She has been compliant nonweightbearing.  She has very minimal to no pain.  Examination of her right ankle reveals well healing surgical incisions with nylon sutures in place.  No evidence of infection.  She does have moderate swelling to the right foot.  Toes are warm and well-perfused.  She is neurovascular intact distally.  Today, nylon sutures were removed.  The patient has been instructed to elevate as much as possible for pain and swelling.  We will place her in a cam walker nonweightbearing for the next 4 weeks.  She will follow-up with [place] at that point for repeat three-view x-rays of the ankle and transition to outpatient physical therapy.  Call with concerns or questions in the meantime.  Follow-Up Instructions: Return in about 4 weeks (around 06/27/2018).   Orders:  Orders Placed This Encounter  Procedures  . XR Ankle Complete Right   No orders of the defined types were placed in this encounter.   Imaging: Xr Ankle Complete Right  Result Date: 05/30/2018 X-rays demonstrate stable alignment of the fracture and hardware   PMFS History: Patient Active Problem List   Diagnosis Date Noted  . Closed bimalleolar fracture of right ankle 05/10/2018   Past Medical History:  Diagnosis Date  . Family history of adverse reaction to anesthesia    uncle "hard time breathing with anesthesia"  . Medical history  non-contributory     History reviewed. No pertinent family history.  Past Surgical History:  Procedure Laterality Date  . NO PAST SURGERIES    . ORIF ANKLE FRACTURE    . ORIF ANKLE FRACTURE Right 05/16/2018   Procedure: OPEN REDUCTION INTERNAL FIXATION (ORIF) RIGHT BIMALLEOLAR ANKLE FRACTURE;  Surgeon: Tarry Kos, MD;  Location: [facility];  Service: Orthopedics;  Laterality: Right;   Social History   Occupational History  . Not on file  Tobacco Use  . Smoking status: Never Smoker  . Smokeless tobacco: Never Used  Substance and Sexual Activity  . Alcohol use: Never    Frequency: Never  . Drug use: Never  . Sexual activity: Not on file

## 2018-06-27 ENCOUNTER — Ambulatory Visit (INDEPENDENT_AMBULATORY_CARE_PROVIDER_SITE_OTHER): Payer: Self-pay | Admitting: Orthopaedic Surgery

## 2018-06-27 ENCOUNTER — Ambulatory Visit (INDEPENDENT_AMBULATORY_CARE_PROVIDER_SITE_OTHER): Payer: Self-pay

## 2018-06-27 ENCOUNTER — Other Ambulatory Visit: Payer: Self-pay

## 2018-06-27 ENCOUNTER — Encounter (INDEPENDENT_AMBULATORY_CARE_PROVIDER_SITE_OTHER): Payer: Self-pay | Admitting: Orthopaedic Surgery

## 2018-06-27 DIAGNOSIS — S82841D Displaced bimalleolar fracture of right lower leg, subsequent encounter for closed fracture with routine healing: Secondary | ICD-10-CM

## 2018-06-27 NOTE — Progress Notes (Signed)
   Post-Op Visit Note   Patient: Katrina West           Date of Birth: 11-Nov-1990           MRN: [id number] Visit Date: 06/27/2018 PCP: Patient, No Pcp Per   Assessment & Plan:  Chief Complaint:  Chief Complaint  Patient presents with  . Right Ankle - Follow-up   Visit Diagnoses:  1. Closed bimalleolar fracture of right ankle with routine healing, subsequent encounter     Plan: Patient is 6 weeks status post ORIF right bimalleolar ankle fracture.  She is overall doing well.  No real complaints.  Surgical scar is healed.  No signs of infection.  She does have a couple suture knots that we removed.  X-rays demonstrate continued healing of the fracture without any complications.  At this point we will allow her to weight-bear as tolerated in a cam boot and we have sent her to outpatient physical therapy for ankle rehab.  She may wean the boot as tolerated.  We will recheck her in 6 weeks with three-view x-rays of the right ankle.  Today's encounter was performed through an interpreter.  Follow-Up Instructions: Return in about 6 weeks (around 08/08/2018).   Orders:  Orders Placed This Encounter  Procedures  . XR Ankle Complete Right   No orders of the defined types were placed in this encounter.   Imaging: Xr Ankle Complete Right  Result Date: 06/27/2018 Stable bimalleolar ankle fracture fixation.  No complications.  Fractures are healing with bony consolidation.   PMFS History: Patient Active Problem List   Diagnosis Date Noted  . Closed bimalleolar fracture of right ankle 05/10/2018   Past Medical History:  Diagnosis Date  . Family history of adverse reaction to anesthesia    uncle "hard time breathing with anesthesia"  . Medical history non-contributory     History reviewed. No pertinent family history.  Past Surgical History:  Procedure Laterality Date  . NO PAST SURGERIES    . ORIF ANKLE FRACTURE    . ORIF ANKLE FRACTURE Right 05/16/2018   Procedure:  OPEN REDUCTION INTERNAL FIXATION (ORIF) RIGHT BIMALLEOLAR ANKLE FRACTURE;  Surgeon: Tarry Kos, MD;  Location: [facility];  Service: Orthopedics;  Laterality: Right;   Social History   Occupational History  . Not on file  Tobacco Use  . Smoking status: Never Smoker  . Smokeless tobacco: Never Used  Substance and Sexual Activity  . Alcohol use: Never    Frequency: Never  . Drug use: Never  . Sexual activity: Not on file

## 2018-07-03 ENCOUNTER — Other Ambulatory Visit: Payer: Self-pay

## 2018-07-03 ENCOUNTER — Ambulatory Visit: Payer: Self-pay | Attending: Orthopaedic Surgery | Admitting: Physical Therapy

## 2018-07-03 ENCOUNTER — Encounter: Payer: Self-pay | Admitting: Physical Therapy

## 2018-07-03 DIAGNOSIS — R262 Difficulty in walking, not elsewhere classified: Secondary | ICD-10-CM | POA: Insufficient documentation

## 2018-07-03 DIAGNOSIS — M25671 Stiffness of right ankle, not elsewhere classified: Secondary | ICD-10-CM | POA: Insufficient documentation

## 2018-07-03 DIAGNOSIS — M25571 Pain in right ankle and joints of right foot: Secondary | ICD-10-CM | POA: Insufficient documentation

## 2018-07-03 NOTE — Therapy (Signed)
[facility] [address] [place], [place], [address] Phone: [phone]   Fax:  [phone]  Physical Therapy Treatment  Patient Details  Name: Katrina West MRN: [id number] Date of Birth: 03-27-1990 Referring Provider (PT): Gershon Mussel, MD   Encounter Date: 07/03/2018  PT End of Session - 07/03/18 0942    Visit Number  1    Number of Visits  12    Date for PT Re-Evaluation  08/14/18    PT Start Time  0815    PT Stop Time  0906    PT Time Calculation (min)  51 min    Activity Tolerance  Patient tolerated treatment well    Behavior During Therapy  [facility] for tasks assessed/performed       Past Medical History:  Diagnosis Date  . Family history of adverse reaction to anesthesia    uncle "hard time breathing with anesthesia"  . Medical history non-contributory     Past Surgical History:  Procedure Laterality Date  . NO PAST SURGERIES    . ORIF ANKLE FRACTURE    . ORIF ANKLE FRACTURE Right 05/16/2018   Procedure: OPEN REDUCTION INTERNAL FIXATION (ORIF) RIGHT BIMALLEOLAR ANKLE FRACTURE;  Surgeon: Tarry Kos, MD;  Location: [facility];  Service: Orthopedics;  Laterality: Right;    There were no vitals filed for this visit.  Subjective Assessment - 07/03/18 1126    Subjective  COVID-19 screening performed prior to patient entering the building. Patient arrives to physical therapy with language interpreter services via telephone with reports of right ankle pain and difficulty walking due to a right ankle fracture. Patient reported she missed one or two steps and fell on April 30, 2018. She underwent an ORIF to stabilize the fracture on 05/16/2018. Patient reported she was ambulating with crutches but now ambulates without an AD and a CAM boot donned. She reports having family assist with ADLs when necessary. Patient reports pain at worst is 3/10 with walking and pain at best is 0/10. Patient's goals are to decrease pain  and walk better.    Patient is accompained by:  Interpreter    Pertinent History  S/P bimalleolar fracture right ankle ORIF 05/16/2018.    Limitations  Standing;Walking;House hold activities    Diagnostic tests  x-ray    Patient Stated Goals  walk better.    Currently in Pain?  No/denies   no pain at rest   Pain Score  0-No pain    Pain Location  Ankle    Pain Orientation  Right    Pain Type  Surgical pain    Pain Onset  More than a month ago    Pain Frequency  Occasional    Aggravating Factors   walking and standing too long    Pain Relieving Factors  resting    Effect of Pain on Daily Activities  walking on it and standing for ADLs         [facility] PT Assessment - 07/03/18 0001      Assessment   Medical Diagnosis  R ankle ORIF    Referring Provider (PT)  Gershon Mussel, MD    Onset Date/Surgical Date  05/16/18    Next MD Visit  "6 weeks"    Prior Therapy  no      Precautions   Precautions  None      Restrictions   Weight Bearing Restrictions  Yes    RLE Weight Bearing  Weight bearing as tolerated  Other Position/Activity Restrictions  WBAT in CAM boot, may wean out of boot per 06/27/2018 follow up visit       Balance Screen   Has the patient fallen in the past 6 months  Yes    How many times?  1    Has the patient had a decrease in activity level because of a fear of falling?   No    Is the patient reluctant to leave their home because of a fear of falling?   No      Home Public house manager residence      Prior Function   Level of Independence  Independent with basic ADLs      ROM / Strength   AROM / PROM / Strength  Strength;AROM;PROM      AROM   AROM Assessment Site  Ankle    Right/Left Ankle  Right    Right Ankle Dorsiflexion  2    Right Ankle Plantar Flexion  48    Right Ankle Inversion  22    Right Ankle Eversion  12      PROM   Overall PROM   Deficits;Due to pain    PROM Assessment Site  Ankle    Right/Left Ankle  Right    Right  Ankle Dorsiflexion  8    Right Ankle Plantar Flexion  50    Right Ankle Inversion  30    Right Ankle Eversion  20      Strength   Overall Strength  Deficits;Due to pain    Strength Assessment Site  Ankle    Right/Left Ankle  Right    Right Ankle Dorsiflexion  4/5    Right Ankle Inversion  4/5    Right Ankle Eversion  4/5      Palpation   Palpation comment  tenderness to palpation to right calf, peroneals, and over lateral and medial scars.       Ambulation/Gait   Gait Pattern  Step-through pattern;Decreased stance time - right;Decreased step length - left;Decreased stride length;Antalgic    Ambulation Surface  Level;Indoor    Gait Comments  with CAM boot                           PT Education - 07/03/18 [phone]    Education Details  ankle circles, ankle pumps, towel curls, seated heel raises, standing gastroc and soleus stretch    Person(s) Educated  Patient    Methods  Explanation;Demonstration;Handout    Comprehension  Verbalized understanding;Returned demonstration          PT Long Term Goals - 07/03/18 0944      PT LONG TERM GOAL #1   Title  Patient will be independent with HEP    Time  6    Period  Weeks    Status  New      PT LONG TERM GOAL #2   Title  Patient will demonstrate 10 degrees of DF to improve gait mechanics.    Time  6    Period  Weeks    Status  New      PT LONG TERM GOAL #3   Title  Patient will ambulate with a normalized gait pattern with pain less than or equal to 2/10.    Time  6    Period  Weeks    Status  New      PT LONG TERM GOAL #4   Title  Patient will demonstrate 4+/5 or greater right ankle MMT to improve stability during functional tasks.     Time  6    Period  Weeks    Status  New      PT LONG TERM GOAL #5   Title  Patient will report ability to perform all ADLs independently with pain in right ankle less than or equal to 3/10.    Time  6    Period  Weeks    Status  New            Plan - 07/03/18  1130    Clinical Impression Statement  Patient is a 28 year old Spanish speaking female who presents to physical therapy with decreased right ankle ROM, pain, and difficulty walking status post right ankle ORIF on 05/16/2018. Patient's visit was translated using the telephone language line. Patient's history was cross-referenced with documentation from ER visit and follow up MD visits. Patient very tender to palpation to right calf, right peroneals, tender to palpation to medial and lateral scars. Patient noted with increased scar tissue in superior aspect of lateral scar. Patient ambulates with an antalgic gait pattern, decreased left step length, and decreased right stance time with CAM boot donned. Patient and PT reviewed HEP to which language interpreter stated patient understanding. Patient would benefit from skilled physical therapy to address deficits and address patient's goals.    Personal Factors and Comorbidities  Age    Examination-Activity Limitations  Caring for Others;Locomotion Level    Stability/Clinical Decision Making  Stable/Uncomplicated    Clinical Decision Making  Low    Rehab Potential  Good    PT Frequency  2x / week    PT Duration  6 weeks    PT Treatment/Interventions  ADLs/Self Care Home Management;Electrical Stimulation;Moist Heat;Cryotherapy;Stair training;Gait training;Therapeutic activities;Therapeutic exercise;Patient/family education;Neuromuscular re-education;Balance training;Manual techniques;Dry needling;Passive range of motion;Vasopneumatic Device;Taping    PT Next Visit Plan  Nustep, seated ankle exercises, PROM, modalities PRN for pain relief    PT Home Exercise Plan  see patient education section    Consulted and Agree with Plan of Care  Patient       Patient will benefit from skilled therapeutic intervention in order to improve the following deficits and impairments:  Pain, Decreased activity tolerance, Decreased range of motion, Decreased strength, Decreased  endurance, Decreased balance, Difficulty walking  Visit Diagnosis: Pain in right ankle and joints of right foot - Plan: PT plan of care cert/re-cert  Stiffness of right ankle, not elsewhere classified - Plan: PT plan of care cert/re-cert  Difficulty in walking, not elsewhere classified - Plan: PT plan of care cert/re-cert     Problem List Patient Active Problem List   Diagnosis Date Noted  . Closed bimalleolar fracture of right ankle 05/10/2018   Guss BundeKrystle Kameela Leipold, PT, DPT 07/03/2018, 11:56 AM  [facility] [address] [place], [place], [address] Phone: [phone]   Fax:  [phone]  Name: Ellard Artisreli Lazo Rohman MRN: [id number] Date of Birth: 1990-05-25

## 2018-07-09 ENCOUNTER — Encounter: Payer: Self-pay | Admitting: Physical Therapy

## 2018-07-11 ENCOUNTER — Encounter: Payer: Self-pay | Admitting: Physical Therapy

## 2018-07-16 ENCOUNTER — Other Ambulatory Visit: Payer: Self-pay

## 2018-07-16 ENCOUNTER — Ambulatory Visit: Payer: Self-pay | Admitting: Physical Therapy

## 2018-07-16 ENCOUNTER — Encounter: Payer: Self-pay | Admitting: Physical Therapy

## 2018-07-16 DIAGNOSIS — R262 Difficulty in walking, not elsewhere classified: Secondary | ICD-10-CM

## 2018-07-16 DIAGNOSIS — M25671 Stiffness of right ankle, not elsewhere classified: Secondary | ICD-10-CM

## 2018-07-16 DIAGNOSIS — M25571 Pain in right ankle and joints of right foot: Secondary | ICD-10-CM

## 2018-07-16 NOTE — Therapy (Signed)
[facility] [address] [place], [place], [address] Phone: [phone]   Fax:  [phone]  Physical Therapy Treatment  Patient Details  Name: Katrina West MRN: [id number] Date of Birth: 1990/03/23 Referring Provider (PT): Gershon MusselNaiping Xu, MD   Encounter Date: 07/16/2018  PT End of Session - 07/16/18 1115    Visit Number  2    Number of Visits  12    Date for PT Re-Evaluation  08/14/18    PT Start Time  1113    PT Stop Time  1158    PT Time Calculation (min)  45 min    Activity Tolerance  Patient tolerated treatment well    Behavior During Therapy  [facility] for tasks assessed/performed       Past Medical History:  Diagnosis Date  . Family history of adverse reaction to anesthesia    uncle "hard time breathing with anesthesia"  . Medical history non-contributory     Past Surgical History:  Procedure Laterality Date  . NO PAST SURGERIES    . ORIF ANKLE FRACTURE    . ORIF ANKLE FRACTURE Right 05/16/2018   Procedure: OPEN REDUCTION INTERNAL FIXATION (ORIF) RIGHT BIMALLEOLAR ANKLE FRACTURE;  Surgeon: Tarry KosXu, Naiping M, MD;  Location: [facility];  Service: Orthopedics;  Laterality: Right;    There were no vitals filed for this visit.  Subjective Assessment - 07/16/18 1114    Subjective  COVID 19 screening performed on patient prior to entering building. Denies any pain or limitations at home. Asked regarding her driving status.    Pertinent History  S/P bimalleolar fracture right ankle ORIF 05/16/2018.    Limitations  Standing;Walking;House hold activities    Diagnostic tests  x-ray    Patient Stated Goals  walk better.    Currently in Pain?  No/denies         [facility] - 07/16/18 0001      Assessment   Medical Diagnosis  R ankle ORIF    Referring Provider (PT)  Gershon MusselNaiping Xu, MD    Onset Date/Surgical Date  05/16/18    Next MD Visit  "6 weeks"    Prior Therapy  no      Precautions   Precautions  None       Restrictions   Weight Bearing Restrictions  Yes    RLE Weight Bearing  Weight bearing as tolerated    Other Position/Activity Restrictions  WBAT in CAM boot, may wean out of boot per 06/27/2018 follow up visit                    [facility] Adult PT Treatment/Exercise - 07/16/18 0001      Exercises   Exercises  Ankle      Modalities   Modalities  Vasopneumatic      Vasopneumatic   Number Minutes Vasopneumatic   15 minutes    Vasopnuematic Location   Ankle    Vasopneumatic Pressure  Medium    Vasopneumatic Temperature   34      Ankle Exercises: Aerobic   Nustep  L5 x10 min      Ankle Exercises: Standing   Rocker Board  3 minutes    Heel Raises  Both;20 reps    Toe Raise  20 reps    Other Standing Ankle Exercises  R ankle dynadisc for PF/DF, Inv/Ev, circles    Other Standing Ankle Exercises  R forward and lateral lunges x15 reps each  Ankle Exercises: Seated   Ankle Circles/Pumps  AROM;Left;20 reps                  PT Long Term Goals - 07/16/18 1221      PT LONG TERM GOAL #1   Title  Patient will be independent with HEP    Time  6    Period  Weeks    Status  Achieved      PT LONG TERM GOAL #2   Title  Patient will demonstrate 10 degrees of DF to improve gait mechanics.    Time  6    Period  Weeks    Status  On-going      PT LONG TERM GOAL #3   Title  Patient will ambulate with a normalized gait pattern with pain less than or equal to 2/10.    Time  6    Period  Weeks    Status  On-going      PT LONG TERM GOAL #4   Title  Patient will demonstrate 4+/5 or greater right ankle MMT to improve stability during functional tasks.     Time  6    Period  Weeks    Status  On-going      PT LONG TERM GOAL #5   Title  Patient will report ability to perform all ADLs independently with pain in right ankle less than or equal to 3/10.    Time  6    Period  Weeks    Status  On-going            Plan - 07/16/18 1157    Clinical Impression  Statement  Patient who is a spanish speaking adult presented in clinic thus treatment completed via assist from telephone interpreter services. Patient denied any limitations with ADLs or in her home enviroment and denied R ankle pain. Patient able to complete all exercises as directed with reports of discomfort with PF, and inversion with indication of the pain along lateral ankle incision. Min- mod edema surrounding R ankle today with bruising observed inferior and posterior to R lateral malleoli today which patient indicated has been present since injury. Patient still reporting tenderness with palpation to lateral calf and superior incision of medial ankle. Patient reports compliance with HEP and educated to speak with MD at next visit regarding driving. Normal vasopneumatic response noted following removal of the modality.    Personal Factors and Comorbidities  Age    Examination-Activity Limitations  Caring for Others;Locomotion Level    Stability/Clinical Decision Making  Stable/Uncomplicated    Rehab Potential  Good    PT Frequency  2x / week    PT Duration  6 weeks    PT Treatment/Interventions  ADLs/Self Care Home Management;Electrical Stimulation;Moist Heat;Cryotherapy;Stair training;Gait training;Therapeutic activities;Therapeutic exercise;Patient/family education;Neuromuscular re-education;Balance training;Manual techniques;Dry needling;Passive range of motion;Vasopneumatic Device;Taping    PT Next Visit Plan  Nustep, seated ankle exercises, PROM, modalities PRN for pain relief    PT Home Exercise Plan  see patient education section    Consulted and Agree with Plan of Care  Patient       Patient will benefit from skilled therapeutic intervention in order to improve the following deficits and impairments:  Pain, Decreased activity tolerance, Decreased range of motion, Decreased strength, Decreased endurance, Decreased balance, Difficulty walking  Visit Diagnosis: Pain in right ankle and  joints of right foot  Stiffness of right ankle, not elsewhere classified  Difficulty in walking, not elsewhere classified  Problem List Patient Active Problem List   Diagnosis Date Noted  . Closed bimalleolar fracture of right ankle 05/10/2018    Marvell Fuller, PTA 07/16/2018, 12:21 PM  [facility] [address] [place], [place], [address] Phone: [phone]   Fax:  [phone]  Name: Katrina West MRN: [id number] Date of Birth: 01-Jul-1990

## 2018-07-18 ENCOUNTER — Encounter: Payer: Self-pay | Admitting: Physical Therapy

## 2018-07-18 ENCOUNTER — Ambulatory Visit: Payer: Self-pay | Admitting: Physical Therapy

## 2018-07-18 ENCOUNTER — Other Ambulatory Visit: Payer: Self-pay

## 2018-07-18 DIAGNOSIS — M25671 Stiffness of right ankle, not elsewhere classified: Secondary | ICD-10-CM

## 2018-07-18 DIAGNOSIS — M25571 Pain in right ankle and joints of right foot: Secondary | ICD-10-CM

## 2018-07-18 DIAGNOSIS — R262 Difficulty in walking, not elsewhere classified: Secondary | ICD-10-CM

## 2018-07-18 NOTE — Therapy (Signed)
[facility] [address] [place], [place], [address] Phone: [phone]   Fax:  [phone]  Physical Therapy Treatment  Patient Details  Name: Katrina West MRN: [id number] Date of Birth: November 26, 1990 Referring Provider (PT): Gershon Mussel, MD   Encounter Date: 07/18/2018  PT End of Session - 07/18/18 1146    Visit Number  3    Number of Visits  12    Date for PT Re-Evaluation  08/14/18    PT Start Time  1109    PT Stop Time  1158    PT Time Calculation (min)  49 min    Activity Tolerance  Patient tolerated treatment well    Behavior During Therapy  [facility] for tasks assessed/performed       Past Medical History:  Diagnosis Date  . Family history of adverse reaction to anesthesia    uncle "hard time breathing with anesthesia"  . Medical history non-contributory     Past Surgical History:  Procedure Laterality Date  . NO PAST SURGERIES    . ORIF ANKLE FRACTURE    . ORIF ANKLE FRACTURE Right 05/16/2018   Procedure: OPEN REDUCTION INTERNAL FIXATION (ORIF) RIGHT BIMALLEOLAR ANKLE FRACTURE;  Surgeon: Tarry Kos, MD;  Location: [facility];  Service: Orthopedics;  Laterality: Right;    There were no vitals filed for this visit.  Subjective Assessment - 07/18/18 1114    Subjective  COVID 19 screening performed on patient prior to entering building. Patient reported no new complaints. Patient denied pain at start of session.    Patient is accompained by:  Interpreter   via language line   Pertinent History  S/P bimalleolar fracture right ankle ORIF 05/16/2018.    Limitations  Standing;Walking;House hold activities    Diagnostic tests  x-ray    Patient Stated Goals  walk better.         [facility] PT Assessment - 07/18/18 0001      Assessment   Medical Diagnosis  R ankle ORIF    Referring Provider (PT)  Gershon Mussel, MD    Onset Date/Surgical Date  05/16/18    Next MD Visit  "6 weeks"    Prior Therapy  no      Precautions   Precautions  None      Restrictions   Weight Bearing Restrictions  Yes    RLE Weight Bearing  Weight bearing as tolerated    Other Position/Activity Restrictions  WBAT in CAM boot, may wean out of boot per 06/27/2018 follow up visit                    [facility] Adult PT Treatment/Exercise - 07/18/18 0001      Exercises   Exercises  Ankle      Modalities   Modalities  Vasopneumatic      Vasopneumatic   Number Minutes Vasopneumatic   15 minutes    Vasopnuematic Location   Ankle    Vasopneumatic Pressure  Medium    Vasopneumatic Temperature   34      Ankle Exercises: Aerobic   Nustep  L3 x10 min      Ankle Exercises: Standing   Rocker Board  3 minutes;Other (comment)   x3 minutes lateral   Heel Raises  Both;20 reps    Toe Raise  20 reps    Other Standing Ankle Exercises  Narrow base of support balance x1 minute, Tandem stance bilaterally x1 minute each    Other Standing Ankle  Exercises  R forward lunges x20 reps, forward step ups on 4" step x20, step downs terminated due to pain                  PT Long Term Goals - 07/16/18 1221      PT LONG TERM GOAL #1   Title  Patient will be independent with HEP    Time  6    Period  Weeks    Status  Achieved      PT LONG TERM GOAL #2   Title  Patient will demonstrate 10 degrees of DF to improve gait mechanics.    Time  6    Period  Weeks    Status  On-going      PT LONG TERM GOAL #3   Title  Patient will ambulate with a normalized gait pattern with pain less than or equal to 2/10.    Time  6    Period  Weeks    Status  On-going      PT LONG TERM GOAL #4   Title  Patient will demonstrate 4+/5 or greater right ankle MMT to improve stability during functional tasks.     Time  6    Period  Weeks    Status  On-going      PT LONG TERM GOAL #5   Title  Patient will report ability to perform all ADLs independently with pain in right ankle less than or equal to 3/10.    Time  6    Period   Weeks    Status  On-going            Plan - 07/18/18 1146    Clinical Impression Statement  Patient arrives without wearing her CAM boot ambulating with an antalgic gait pattern and decreased stance time on right LE. Patient's session was interpreted by Madaline Guthrie via the telephone language line. Patient was able to tolerate treatment well but noted with pain with step downs. Exercise terminated due to pain. Patient reported pain elevated to 4-5/10. Patient denied pain with other exercises. Patient was able to perform balance activities with close supervision. Normal response to modalities upon removal.    Personal Factors and Comorbidities  Age    Examination-Activity Limitations  Caring for Others;Locomotion Level    Stability/Clinical Decision Making  Stable/Uncomplicated    Clinical Decision Making  Low    Rehab Potential  Good    PT Frequency  2x / week    PT Duration  6 weeks    PT Treatment/Interventions  ADLs/Self Care Home Management;Electrical Stimulation;Moist Heat;Cryotherapy;Stair training;Gait training;Therapeutic activities;Therapeutic exercise;Patient/family education;Neuromuscular re-education;Balance training;Manual techniques;Dry needling;Passive range of motion;Vasopneumatic Device;Taping    PT Next Visit Plan  Nustep, seated ankle exercises, PROM, modalities PRN for pain relief    Consulted and Agree with Plan of Care  Patient       Patient will benefit from skilled therapeutic intervention in order to improve the following deficits and impairments:  Pain, Decreased activity tolerance, Decreased range of motion, Decreased strength, Decreased endurance, Decreased balance, Difficulty walking  Visit Diagnosis: Pain in right ankle and joints of right foot  Stiffness of right ankle, not elsewhere classified  Difficulty in walking, not elsewhere classified     Problem List Patient Active Problem List   Diagnosis Date Noted  . Closed bimalleolar fracture of right  ankle 05/10/2018    Guss Bunde, PT, DPT 07/18/2018, 12:13 PM  [facility] [address] [place],  [place], [address] Phone: [phone]   Fax:  [phone]  Name: Katrina West MRN: [id number] Date of Birth: 02/02/1991

## 2018-07-23 ENCOUNTER — Other Ambulatory Visit: Payer: Self-pay

## 2018-07-23 ENCOUNTER — Ambulatory Visit: Payer: Self-pay | Admitting: Physical Therapy

## 2018-07-23 DIAGNOSIS — M25671 Stiffness of right ankle, not elsewhere classified: Secondary | ICD-10-CM

## 2018-07-23 DIAGNOSIS — M25571 Pain in right ankle and joints of right foot: Secondary | ICD-10-CM

## 2018-07-23 DIAGNOSIS — R262 Difficulty in walking, not elsewhere classified: Secondary | ICD-10-CM

## 2018-07-23 NOTE — Therapy (Addendum)
[facility] [place], [place], [address] Phone: [phone]   Fax:  [phone]  Physical Therapy Treatment PHYSICAL THERAPY DISCHARGE SUMMARY  Visits from Start of Care: 4  Current functional level related to goals / functional outcomes: See  below   Remaining deficits: See goals   Education / Equipment: HEP Plan: Patient agrees to discharge.  Patient goals were not met. Patient is being discharged due to not returning since the last visit.  ?????   Gabriela Eves, PT, DPT 04/17/19    Patient Details  Name: Katrina West MRN: [id number] Date of Birth: Apr 05, 1990 Referring Provider (PT): Frankey Shown, MD   Encounter Date: 07/23/2018  PT End of Session - 07/23/18 1156    Visit Number  4    Number of Visits  12    Date for PT Re-Evaluation  08/14/18    PT Start Time  1110    PT Stop Time  1200    PT Time Calculation (min)  50 min    Activity Tolerance  Patient tolerated treatment well    Behavior During Therapy  [facility] for tasks assessed/performed       Past Medical History:  Diagnosis Date  . Family history of adverse reaction to anesthesia    uncle "hard time breathing with anesthesia"  . Medical history non-contributory     Past Surgical History:  Procedure Laterality Date  . NO PAST SURGERIES    . ORIF ANKLE FRACTURE    . ORIF ANKLE FRACTURE Right 05/16/2018   Procedure: OPEN REDUCTION INTERNAL FIXATION (ORIF) RIGHT BIMALLEOLAR ANKLE FRACTURE;  Surgeon: Leandrew Koyanagi, MD;  Location: [facility];  Service: Orthopedics;  Laterality: Right;    There were no vitals filed for this visit.  Subjective Assessment - 07/23/18 1147    Subjective  COVID 19 screening performed on patient prior to entering building. Patient reported a little bit of pain in the lateral aspect of the ankle.    Patient is accompained by:  Interpreter   Cam[place] via language line   Pertinent History  S/P  bimalleolar fracture right ankle ORIF 05/16/2018.    Limitations  Standing;Walking;House hold activities    Diagnostic tests  x-ray    Patient Stated Goals  walk better.    Currently in Pain?  Yes    Pain Score  4     Pain Location  Ankle    Pain Orientation  Right;Lateral    Pain Descriptors / Indicators  Discomfort    Pain Type  Surgical pain    Pain Onset  More than a month ago    Pain Frequency  Occasional         [facility] PT Assessment - 07/23/18 0001      Assessment   Medical Diagnosis  R ankle ORIF    Referring Provider (PT)  Frankey Shown, MD    Onset Date/Surgical Date  05/16/18    Next MD Visit  "6 weeks"    Prior Therapy  no      Precautions   Precautions  None      Restrictions   Weight Bearing Restrictions  Yes    RLE Weight Bearing  Weight bearing as tolerated    Other Position/Activity Restrictions  WBAT in CAM boot, may wean out of boot per 06/27/2018 follow up visit                    [facility] Adult PT Treatment/Exercise - 07/23/18  0001      Exercises   Exercises  Ankle      Modalities   Modalities  Vasopneumatic      Vasopneumatic   Number Minutes Vasopneumatic   15 minutes    Vasopnuematic Location   Ankle    Vasopneumatic Pressure  Low    Vasopneumatic Temperature   34      Ankle Exercises: Aerobic   Nustep  L4 x10 min      Ankle Exercises: Standing   Rocker Board  Other (comment)   3 minutes AP and lateral 6 minutes total   Balance Beam  lateral stepping without UE support x5      Ankle Exercises: Seated   Other Seated Ankle Exercises  4 way ankle strengthening (PF,DF,IN, EV) red theraband x20 each                  PT Long Term Goals - 07/16/18 1221      PT LONG TERM GOAL #1   Title  Patient will be independent with HEP    Time  6    Period  Weeks    Status  Achieved      PT LONG TERM GOAL #2   Title  Patient will demonstrate 10 degrees of DF to improve gait mechanics.    Time  6    Period  Weeks    Status   On-going      PT LONG TERM GOAL #3   Title  Patient will ambulate with a normalized gait pattern with pain less than or equal to 2/10.    Time  6    Period  Weeks    Status  On-going      PT LONG TERM GOAL #4   Title  Patient will demonstrate 4+/5 or greater right ankle MMT to improve stability during functional tasks.     Time  6    Period  Weeks    Status  On-going      PT LONG TERM GOAL #5   Title  Patient will report ability to perform all ADLs independently with pain in right ankle less than or equal to 3/10.    Time  6    Period  Weeks    Status  On-going            Plan - 07/23/18 1151    Clinical Impression Statement  Patient arrived wearing sneakers ambulating with an antalgic gait pattern. Patient's session was interpreted by [facility] via the telephone language line. Patient was able to complete exercises with minimal reports of increased pain. Patient was able to perform standing side stepping on balance beam with good use of ankle strategies to maintain balance and with supervision. Patient required demonstration and tactile cuing for form with resisted inversion and eversion to prevent hip compensation. Patient  noted with normal response to modalities upon removal.     Personal Factors and Comorbidities  Age    Examination-Activity Limitations  Caring for Others;Locomotion Level    Stability/Clinical Decision Making  Stable/Uncomplicated    Clinical Decision Making  Low    Rehab Potential  Good    PT Frequency  2x / week    PT Duration  6 weeks    PT Treatment/Interventions  ADLs/Self Care Home Management;Electrical Stimulation;Moist Heat;Cryotherapy;Stair training;Gait training;Therapeutic activities;Therapeutic exercise;Patient/family education;Neuromuscular re-education;Balance training;Manual techniques;Dry needling;Passive range of motion;Vasopneumatic Device;Taping    PT Next Visit Plan  Nustep, seated and standing ankle exercises, PROM, modalities PRN for pain  relief    Consulted and Agree with Plan of Care  Patient       Patient will benefit from skilled therapeutic intervention in order to improve the following deficits and impairments:  Pain, Decreased activity tolerance, Decreased range of motion, Decreased strength, Decreased endurance, Decreased balance, Difficulty walking  Visit Diagnosis: Pain in right ankle and joints of right foot  Stiffness of right ankle, not elsewhere classified  Difficulty in walking, not elsewhere classified     Problem List Patient Active Problem List   Diagnosis Date Noted  . Closed bimalleolar fracture of right ankle 05/10/2018   Gabriela Eves, PT, DPT 07/23/2018, 12:34 PM  [facility] [address] [place], [place], [address] Phone: [phone]   Fax:  [phone]  Name: Joni Colegrove MRN: [id number] Date of Birth: 04-28-1990

## 2018-07-25 ENCOUNTER — Ambulatory Visit: Payer: Self-pay | Admitting: Physical Therapy

## 2018-08-08 ENCOUNTER — Ambulatory Visit (INDEPENDENT_AMBULATORY_CARE_PROVIDER_SITE_OTHER): Payer: Self-pay

## 2018-08-08 ENCOUNTER — Encounter: Payer: Self-pay | Admitting: Orthopaedic Surgery

## 2018-08-08 ENCOUNTER — Ambulatory Visit (INDEPENDENT_AMBULATORY_CARE_PROVIDER_SITE_OTHER): Payer: Self-pay | Admitting: Orthopaedic Surgery

## 2018-08-08 ENCOUNTER — Other Ambulatory Visit: Payer: Self-pay

## 2018-08-08 DIAGNOSIS — S82841D Displaced bimalleolar fracture of right lower leg, subsequent encounter for closed fracture with routine healing: Secondary | ICD-10-CM

## 2018-08-08 NOTE — Progress Notes (Signed)
   Post-Op Visit Note   Patient: Katrina West           Date of Birth: 22-Jan-1991           MRN: [id number] Visit Date: 08/08/2018 PCP: Patient, No Pcp Per   Assessment & Plan:  Chief Complaint:  Chief Complaint  Patient presents with  . Right Ankle - Pain, Follow-up, Routine Post Op   Visit Diagnoses:  1. Closed bimalleolar fracture of right ankle with routine healing, subsequent encounter     Plan: Ms. Pol follows up today status post ORIF right ankle fracture.  She is doing well overall.  She denies any pain and does not take any pain medicines.  She has no real complaints today.  Her surgical scars are fully healed.  She has excellent range of motion of her ankle.  She is pretty much back to normal activities at this point.  X-rays demonstrate healing of the fracture without any complications.  At this point we will release her to activity as tolerated.  Questions encouraged and answered.  Follow-up as needed.  Follow-Up Instructions: Return if symptoms worsen or fail to improve.   Orders:  Orders Placed This Encounter  Procedures  . XR Ankle Complete Right   No orders of the defined types were placed in this encounter.   Imaging: Xr Ankle Complete Right  Result Date: 08/08/2018 X-rays demonstrate healed ankle fracture with stable hardware and without complication.   PMFS History: Patient Active Problem List   Diagnosis Date Noted  . Closed bimalleolar fracture of right ankle 05/10/2018   Past Medical History:  Diagnosis Date  . Family history of adverse reaction to anesthesia    uncle "hard time breathing with anesthesia"  . Medical history non-contributory     History reviewed. No pertinent family history.  Past Surgical History:  Procedure Laterality Date  . NO PAST SURGERIES    . ORIF ANKLE FRACTURE    . ORIF ANKLE FRACTURE Right 05/16/2018   Procedure: OPEN REDUCTION INTERNAL FIXATION (ORIF) RIGHT BIMALLEOLAR ANKLE FRACTURE;  Surgeon: Leandrew Koyanagi, MD;  Location: [facility];  Service: Orthopedics;  Laterality: Right;   Social History   Occupational History  . Not on file  Tobacco Use  . Smoking status: Never Smoker  . Smokeless tobacco: Never Used  Substance and Sexual Activity  . Alcohol use: Never    Frequency: Never  . Drug use: Never  . Sexual activity: Not on file

## 2018-08-13 ENCOUNTER — Ambulatory Visit: Payer: Self-pay | Attending: Orthopaedic Surgery | Admitting: Physical Therapy

## 2019-07-28 IMAGING — DX DG TIBIA/FIBULA 2V*R*
2 series · 2 of 2 positions shown · non-contrast
Comparison: Right ankle radiographs earlier this day.

CLINICAL DATA: Right ankle fracture today.

EXAM:
RIGHT TIBIA AND FIBULA - 2 VIEW

[tibia ap]
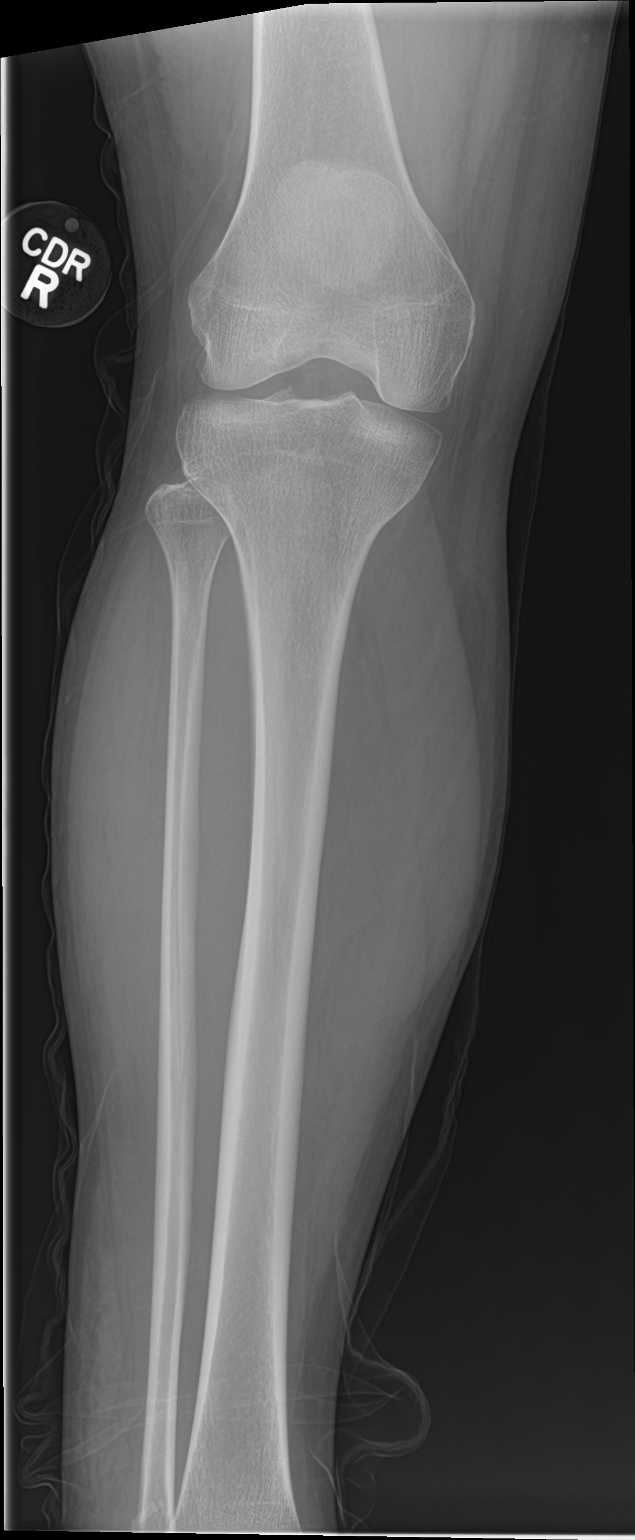

[tibia lat]
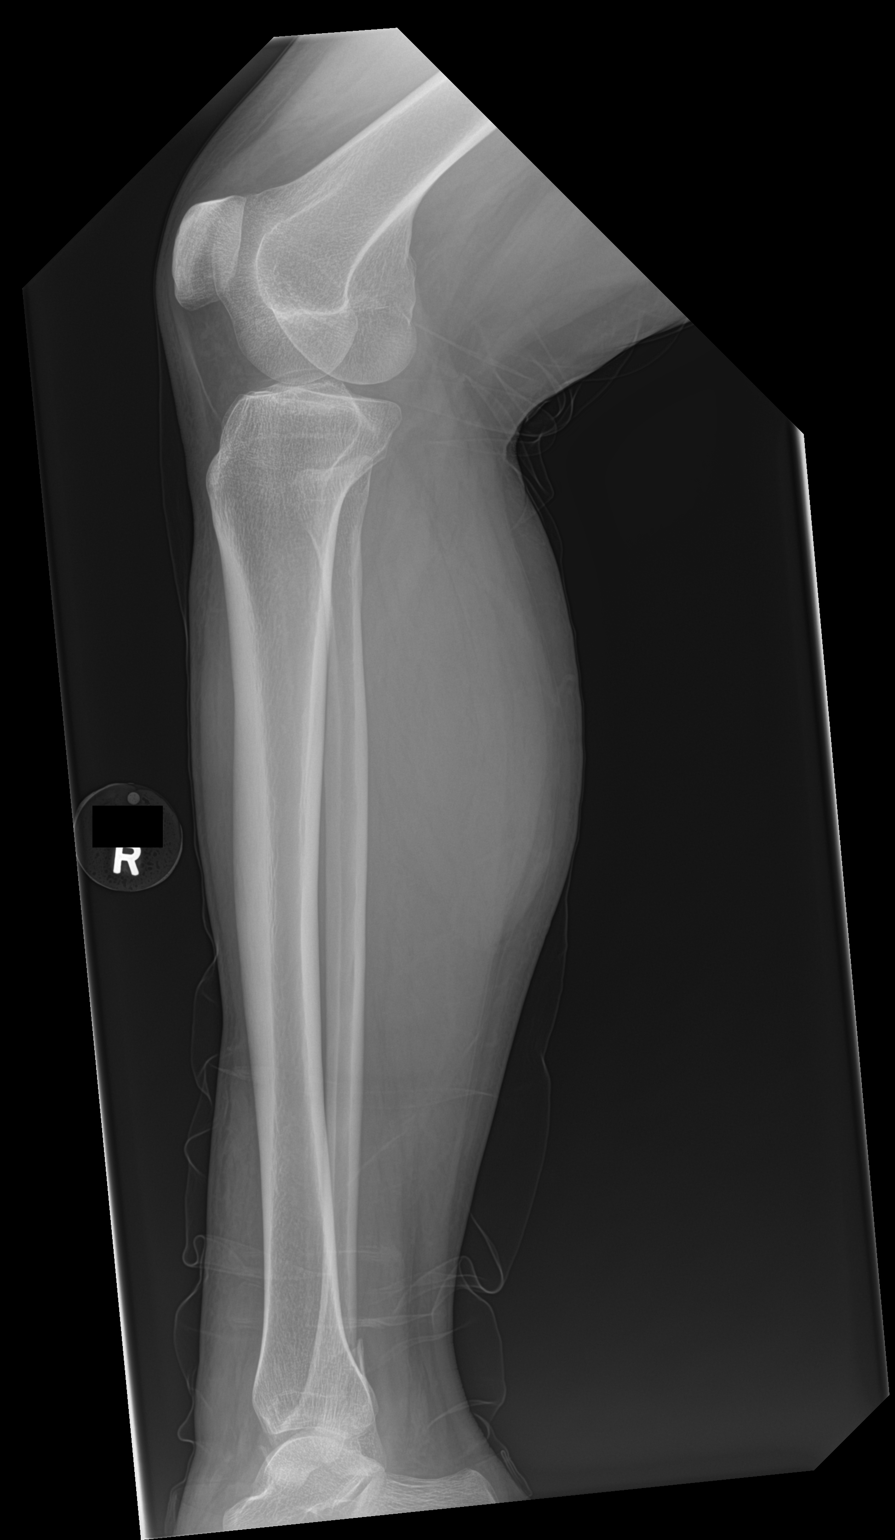

[2 of 2 positions shown; findings below may reference images not displayed]

FINDINGS: Bimalleolar fracture better assessed on ankle exam. The proximal
tibia and fibula are intact. No proximal fracture. Nutrient channel
in the mid fibula.
IMPRESSION: Bimalleolar fracture better assessed on ankle exam. Proximal tibia
and fibula are intact.

## 2019-08-09 IMAGING — RF DG C-ARM 61-120 MIN
1 series · 4 of 4 positions shown · non-contrast
Comparison: Plain films right ankle 05/04/2018.

CLINICAL DATA: The patient suffered right ankle fractures due to a
fall down stairs 05/04/2018. Initial encounter.

EXAM:
RIGHT ANKLE - COMPLETE 3+ VIEW; DG C-ARM 61-120 MIN

[Series 1: run · 4 of 4 slices shown]
[im 1/4]
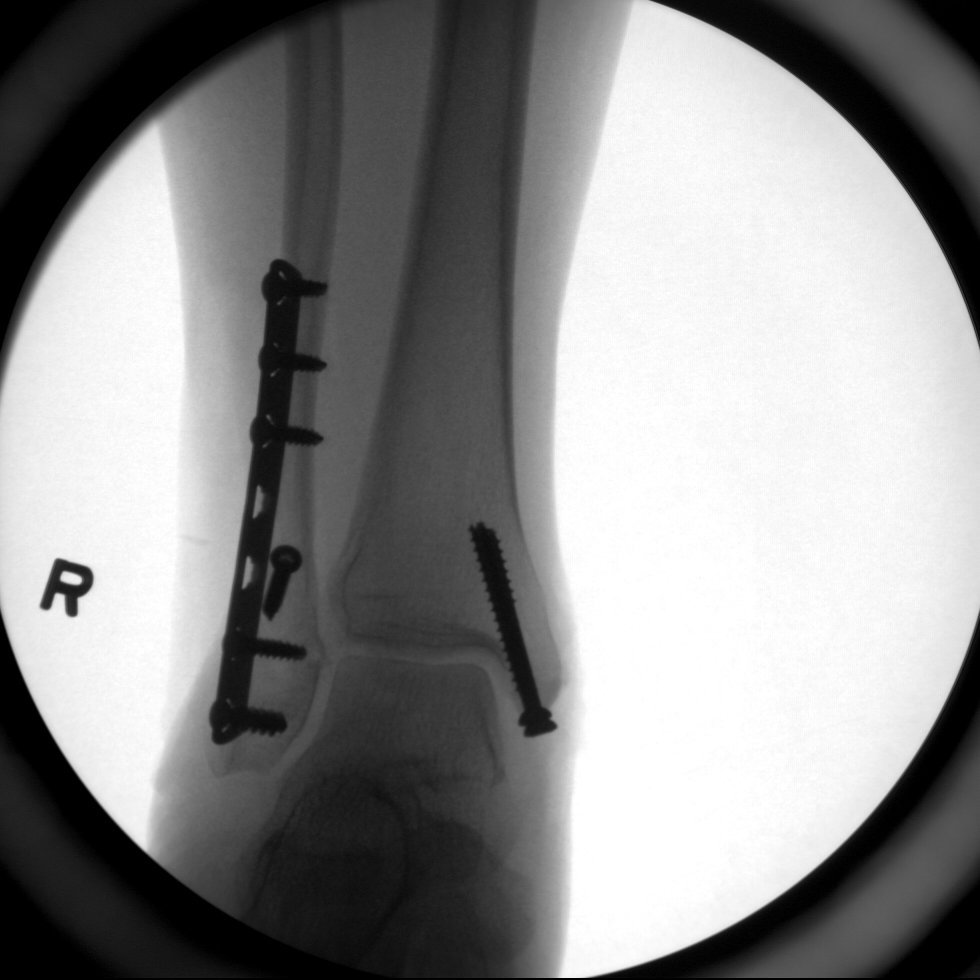
[im 2/4]
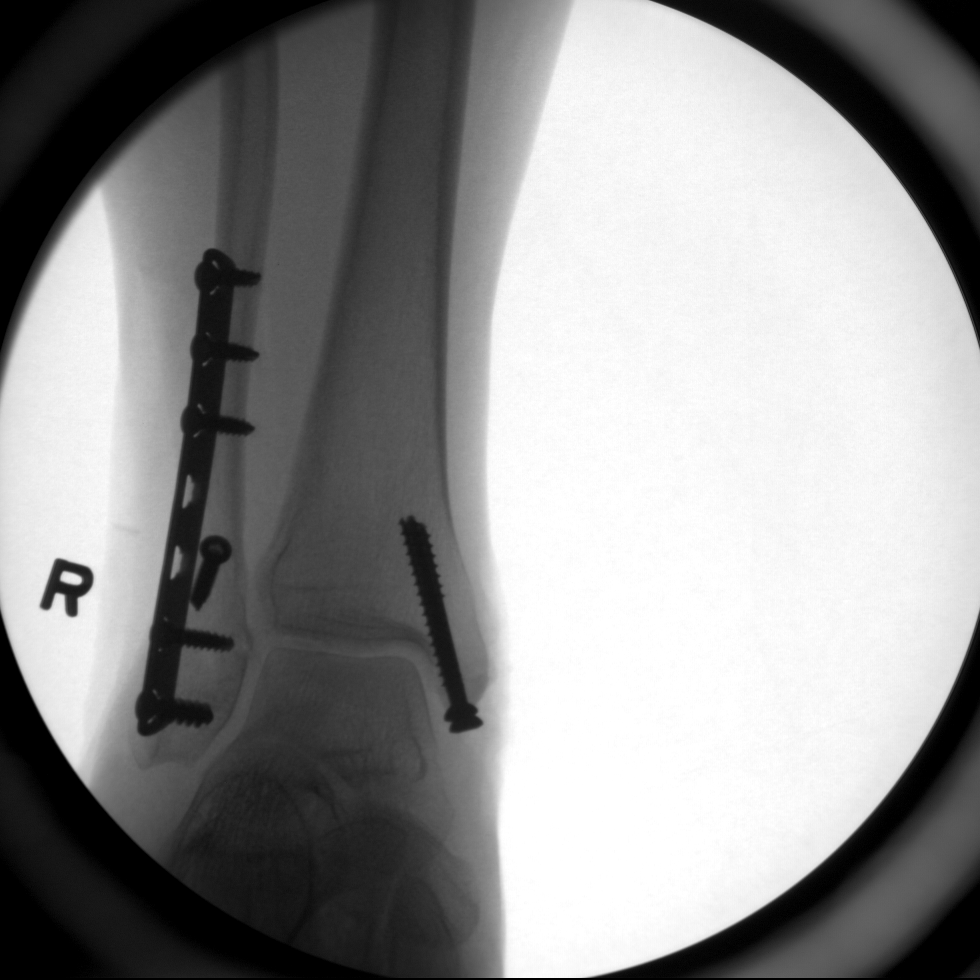
[im 3/4]
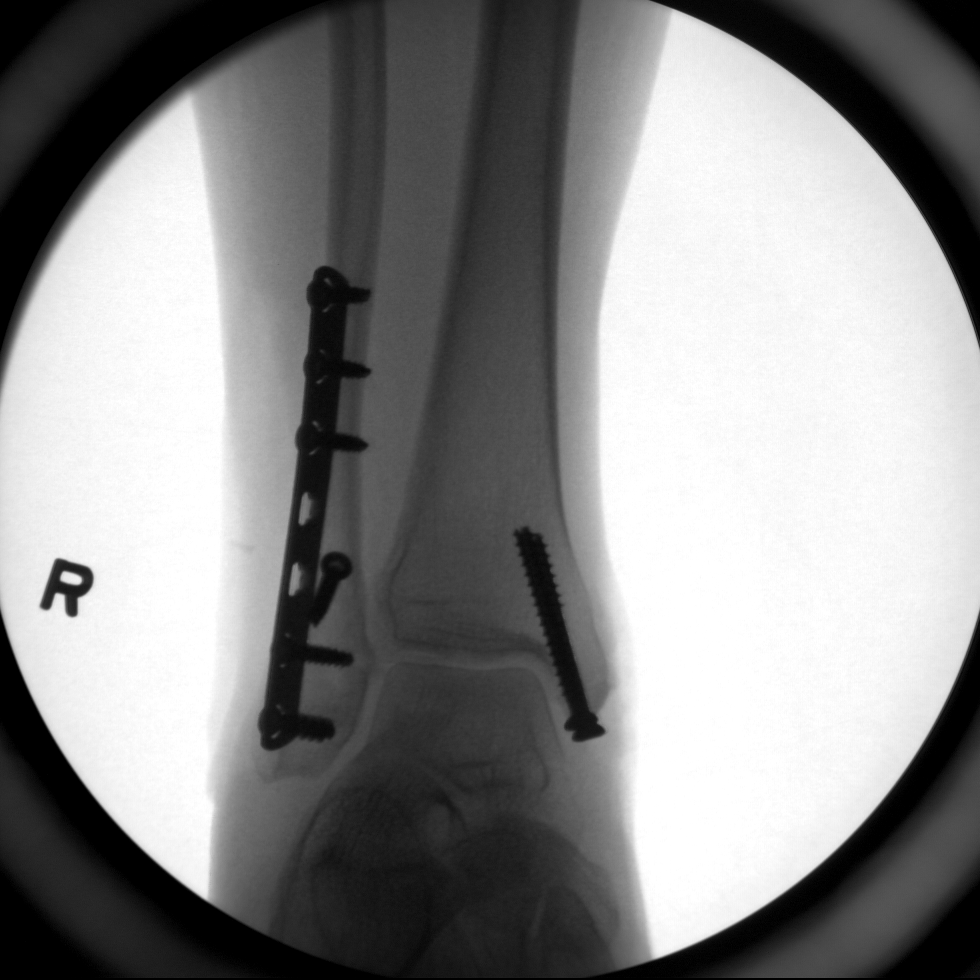
[im 4/4]
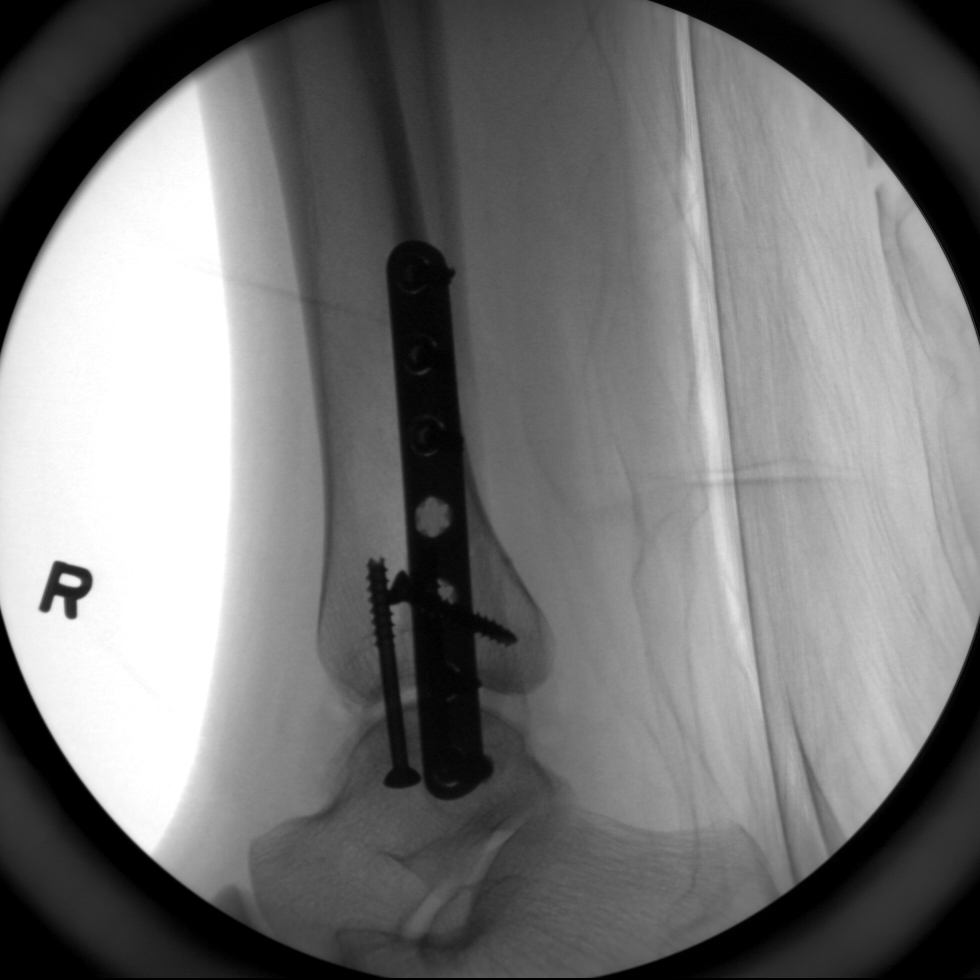

[4 of 4 positions shown; findings below may reference images not displayed]

FINDINGS: Four fluoroscopic intraoperative spot views of the right ankle
demonstrate lateral plate and screws and interfragmentary screw and
2 screws in the medial malleolus for fracture fixation. Position and
alignment are anatomic. No acute abnormality.
IMPRESSION: Intraoperative imaging demonstrating ORIF right ankle fractures.
